# Patient Record
Sex: Female | Born: 1965 | Race: White | Hispanic: No | Marital: Single | Smoking: Current every day smoker
Health system: Southern US, Community
[De-identification: ages and names within clinical notes are randomized; demographics above are authoritative.]

## PROBLEM LIST (undated history)

## (undated) DIAGNOSIS — C539 Malignant neoplasm of cervix uteri, unspecified: Secondary | ICD-10-CM

## (undated) DIAGNOSIS — I10 Essential (primary) hypertension: Secondary | ICD-10-CM

## (undated) HISTORY — PX: CERVIX REMOVAL: SHX592

## (undated) HISTORY — PX: DILATION AND CURETTAGE OF UTERUS: SHX78

## (undated) HISTORY — PX: OTHER SURGICAL HISTORY: SHX169

---

## 2004-09-28 ENCOUNTER — Emergency Department: Payer: Self-pay | Admitting: Internal Medicine

## 2007-10-29 ENCOUNTER — Emergency Department: Payer: Self-pay | Admitting: Emergency Medicine

## 2008-12-23 ENCOUNTER — Emergency Department: Payer: Self-pay | Admitting: Emergency Medicine

## 2009-10-16 ENCOUNTER — Emergency Department: Payer: Self-pay | Admitting: Emergency Medicine

## 2010-09-22 ENCOUNTER — Emergency Department: Payer: Self-pay | Admitting: Emergency Medicine

## 2010-10-23 HISTORY — PX: CERVICAL CONE BIOPSY: SUR198

## 2010-10-23 HISTORY — PX: OTHER SURGICAL HISTORY: SHX169

## 2010-11-20 ENCOUNTER — Emergency Department: Payer: Self-pay | Admitting: Emergency Medicine

## 2011-01-03 ENCOUNTER — Emergency Department: Payer: Self-pay | Admitting: Internal Medicine

## 2011-06-11 ENCOUNTER — Emergency Department: Payer: Self-pay | Admitting: Emergency Medicine

## 2011-06-11 LAB — COMPREHENSIVE METABOLIC PANEL
Albumin: 3.6 g/dL (ref 3.4–5.0)
Anion Gap: 11 (ref 7–16)
Chloride: 99 mmol/L (ref 98–107)
Co2: 27 mmol/L (ref 21–32)
Creatinine: 0.69 mg/dL (ref 0.60–1.30)
Osmolality: 274 (ref 275–301)
Potassium: 3.5 mmol/L (ref 3.5–5.1)
SGOT(AST): 21 U/L (ref 15–37)
SGPT (ALT): 14 U/L
Sodium: 137 mmol/L (ref 136–145)
Total Protein: 8.2 g/dL (ref 6.4–8.2)

## 2011-06-11 LAB — CBC
HCT: 43 % (ref 35.0–47.0)
HGB: 14.4 g/dL (ref 12.0–16.0)
MCV: 98 fL (ref 80–100)
RBC: 4.41 10*6/uL (ref 3.80–5.20)
RDW: 16.8 % — ABNORMAL HIGH (ref 11.5–14.5)
WBC: 12.4 10*3/uL — ABNORMAL HIGH (ref 3.6–11.0)

## 2011-06-11 LAB — URINALYSIS, COMPLETE
Glucose,UR: NEGATIVE mg/dL (ref 0–75)
Hyaline Cast: 25
Nitrite: NEGATIVE
Protein: 100
RBC,UR: 200 /HPF (ref 0–5)
Specific Gravity: 1.035 (ref 1.003–1.030)
WBC UR: 3809 /HPF (ref 0–5)

## 2011-06-11 LAB — LIPASE, BLOOD: Lipase: 68 U/L — ABNORMAL LOW (ref 73–393)

## 2012-05-23 ENCOUNTER — Emergency Department: Payer: Self-pay | Admitting: Emergency Medicine

## 2013-02-07 ENCOUNTER — Emergency Department: Payer: Self-pay | Admitting: Emergency Medicine

## 2013-02-07 LAB — URINALYSIS, COMPLETE
Ketone: NEGATIVE
Leukocyte Esterase: NEGATIVE
RBC,UR: 2246 /HPF (ref 0–5)
Specific Gravity: 1.02 (ref 1.003–1.030)
Squamous Epithelial: 2
WBC UR: 31 /HPF (ref 0–5)

## 2013-02-07 LAB — BASIC METABOLIC PANEL
Anion Gap: 3 — ABNORMAL LOW (ref 7–16)
BUN: 16 mg/dL (ref 7–18)
Calcium, Total: 9.1 mg/dL (ref 8.5–10.1)
Chloride: 106 mmol/L (ref 98–107)
EGFR (African American): >60
EGFR (Non-African Amer.): >60
Sodium: 136 mmol/L (ref 136–145)

## 2013-02-07 LAB — GC/CHLAMYDIA PROBE AMP

## 2013-02-07 LAB — CBC
HCT: 37.4 % (ref 35.0–47.0)
HGB: 13.1 g/dL (ref 12.0–16.0)
MCH: 34.2 pg — ABNORMAL HIGH (ref 26.0–34.0)
RBC: 3.83 10*6/uL (ref 3.80–5.20)
WBC: 7.7 10*3/uL (ref 3.6–11.0)

## 2013-02-07 LAB — WET PREP, GENITAL

## 2013-06-22 ENCOUNTER — Emergency Department: Payer: Self-pay | Admitting: Emergency Medicine

## 2014-03-05 ENCOUNTER — Emergency Department: Payer: Self-pay | Admitting: Internal Medicine

## 2014-03-05 LAB — CBC WITH DIFFERENTIAL/PLATELET
Basophil #: 0.1 10*3/uL (ref 0.0–0.1)
Basophil %: 0.7 %
EOS ABS: 0.1 10*3/uL (ref 0.0–0.7)
Eosinophil %: 0.8 %
HCT: 44.7 % (ref 35.0–47.0)
HGB: 15.2 g/dL (ref 12.0–16.0)
LYMPHS ABS: 1.2 10*3/uL (ref 1.0–3.6)
Lymphocyte %: 13.3 %
MCH: 34.2 pg — AB (ref 26.0–34.0)
MCHC: 34 g/dL (ref 32.0–36.0)
MCV: 101 fL — ABNORMAL HIGH (ref 80–100)
MONOS PCT: 7.3 %
Monocyte #: 0.6 x10 3/mm (ref 0.2–0.9)
Neutrophil #: 6.8 10*3/uL — ABNORMAL HIGH (ref 1.4–6.5)
Neutrophil %: 77.9 %
PLATELETS: 280 10*3/uL (ref 150–440)
RBC: 4.45 10*6/uL (ref 3.80–5.20)
RDW: 13.4 % (ref 11.5–14.5)
WBC: 8.7 10*3/uL (ref 3.6–11.0)

## 2014-03-05 LAB — BASIC METABOLIC PANEL
Anion Gap: 7 (ref 7–16)
BUN: 15 mg/dL (ref 7–18)
CO2: 29 mmol/L (ref 21–32)
CREATININE: 0.75 mg/dL (ref 0.60–1.30)
Calcium, Total: 9.5 mg/dL (ref 8.5–10.1)
Chloride: 99 mmol/L (ref 98–107)
Glucose: 93 mg/dL (ref 65–99)
OSMOLALITY: 271 (ref 275–301)
Potassium: 3.4 mmol/L — ABNORMAL LOW (ref 3.5–5.1)
Sodium: 135 mmol/L — ABNORMAL LOW (ref 136–145)

## 2014-03-05 LAB — TROPONIN I: Troponin-I: <0.02 ng/mL

## 2014-10-29 ENCOUNTER — Encounter: Admission: RE | Payer: Self-pay | Source: Ambulatory Visit

## 2014-10-29 ENCOUNTER — Ambulatory Visit: Admission: RE | Admit: 2014-10-29 | Payer: Self-pay | Source: Ambulatory Visit | Admitting: Gastroenterology

## 2014-10-29 SURGERY — COLONOSCOPY
Anesthesia: General

## 2014-10-30 ENCOUNTER — Encounter: Payer: Self-pay | Admitting: Registered Nurse

## 2015-02-18 ENCOUNTER — Emergency Department
Admission: EM | Admit: 2015-02-18 | Discharge: 2015-02-18 | Disposition: A | Discharge status: Home / Self Care | Payer: Self-pay | Attending: Emergency Medicine | Admitting: Emergency Medicine

## 2015-02-18 ENCOUNTER — Emergency Department: Payer: Self-pay

## 2015-02-18 DIAGNOSIS — Z720 Tobacco use: Secondary | ICD-10-CM | POA: Insufficient documentation

## 2015-02-18 DIAGNOSIS — M5136 Other intervertebral disc degeneration, lumbar region: Secondary | ICD-10-CM | POA: Insufficient documentation

## 2015-02-18 DIAGNOSIS — M5431 Sciatica, right side: Secondary | ICD-10-CM

## 2015-02-18 DIAGNOSIS — M5430 Sciatica, unspecified side: Secondary | ICD-10-CM | POA: Insufficient documentation

## 2015-02-18 DIAGNOSIS — I10 Essential (primary) hypertension: Secondary | ICD-10-CM | POA: Insufficient documentation

## 2015-02-18 DIAGNOSIS — Z880 Allergy status to penicillin: Secondary | ICD-10-CM | POA: Insufficient documentation

## 2015-02-18 HISTORY — DX: Malignant neoplasm of cervix uteri, unspecified: C53.9

## 2015-02-18 HISTORY — DX: Essential (primary) hypertension: I10

## 2015-02-18 MED ORDER — KETOROLAC TROMETHAMINE 30 MG/ML IJ SOLN
30.0000 mg | Freq: Once | INTRAMUSCULAR | Status: AC
Start: 2015-02-18 — End: 2015-02-18
  Administered 2015-02-18: 30 mg via INTRAMUSCULAR
  Filled 2015-02-18: qty 1

## 2015-02-18 MED ORDER — PREDNISONE 10 MG PO TABS: ORAL_TABLET | ORAL | Status: DC

## 2015-02-18 MED ORDER — CYCLOBENZAPRINE HCL 10 MG PO TABS: ORAL_TABLET | ORAL | Status: DC

## 2015-02-18 NOTE — ED Notes (Signed)
Pt c/o right lower back pain for the past 2 weeks with pain radiating into the right leg.Marland Kitchen

## 2015-02-18 NOTE — Discharge Instructions (Signed)

## 2015-02-18 NOTE — ED Provider Notes (Signed)
CSN: [id number]     Arrival date & time 02/18/15  0801 History   First MD Initiated Contact with Patient 02/18/15 [id number]     Chief Complaint  Patient presents with  . Back Pain     HPI Comments: 49 year old female presents toady complaining of right lower back pain for the past 2 weeks. The pain radiates into her right leg and is stabbing. Also has numbness and tingling in the right leg. No loss of bowel function. Reports urinary urgency and stress incontinence- but this was present prior to the back problem.  Taking over the counter medications without relief. No perianal or genital numbness.   Patient is a 49 y.o. female presenting with back pain. The history is provided by the patient.  Back Pain Location:  Lumbar spine Quality:  Stabbing Radiates to:  R posterior upper leg Pain severity:  Moderate Pain is:  Same all the time Onset quality:  Gradual Duration:  2 weeks Timing:  Constant Progression:  Worsening Chronicity:  Recurrent Context: occupational injury   Relieved by:  None tried Worsened by:  Ambulation, movement, standing and twisting Associated symptoms: numbness and paresthesias   Associated symptoms: no bowel incontinence, no perianal numbness, no tingling and no weakness     Past Medical History  Diagnosis Date  . Hypertension   . Cervical cancer    Past Surgical History  Procedure Laterality Date  . Cervix removal     No family history on file. Social History  Substance Use Topics  . Smoking status: Current Every Day Smoker -- 1.00 packs/day    Types: Cigarettes  . Smokeless tobacco: None  . Alcohol Use: No   OB History    No data available     Review of Systems  Gastrointestinal: Negative for bowel incontinence.  Genitourinary: Positive for urgency.  Musculoskeletal: Positive for myalgias, back pain and arthralgias. Negative for gait problem.  Neurological: Positive for numbness and paresthesias. Negative for tingling and weakness.  All other  systems reviewed and are negative.     Allergies  Demerol; Codeine; and Penicillins  Home Medications   Prior to Admission medications   Medication Sig Start Date End Date Taking? Authorizing Helayne Metsker  cyclobenzaprine (FLEXERIL) 10 MG tablet 1 tab at night as needed 02/18/15   Corliss Parish, PA-C  predniSONE (DELTASONE) 10 MG tablet Taper: 6,5,4,3,2,1 02/18/15   Shayne Alken V, PA-C   BP 133/86 mmHg  Pulse 93  Temp(Src) 98 F (36.7 C) (Oral)  Resp 18  Ht 5\' 6"  (1.676 m)  Wt 145 lb (65.772 kg)  BMI 23.41 kg/m2  SpO2 98% Physical Exam  Constitutional: She is oriented to person, place, and time. Vital signs are normal. She appears well-developed and well-nourished. She is active.  Non-toxic appearance. She does not have a sickly appearance. She does not appear ill.  HENT:  Head: Normocephalic and atraumatic.  Musculoskeletal: Normal range of motion. She exhibits tenderness.       Right hip: Normal. She exhibits normal range of motion, normal strength and no tenderness.       Left hip: Normal. She exhibits normal range of motion, normal strength and no tenderness.       Lumbar back: She exhibits tenderness and bony tenderness. She exhibits no swelling.  TTP over L3-L5. Right lumbar paraspinal muscles tender to palpation   Neurological: She is alert and oriented to person, place, and time. She has normal strength. She displays normal reflexes. She exhibits normal muscle  tone. Coordination and gait normal.  Reflex Scores:      Patellar reflexes are 2+ on the right side and 2+ on the left side. +SLR on the right at 15 degrees Negative SLR on the left Negative FABER bilaterally  Equal great toe raise bilaterally   Skin: Skin is warm and dry.  Psychiatric: She has a normal mood and affect. Her behavior is normal. Judgment and thought content normal.  Nursing note and vitals reviewed.   ED Course  Procedures (including critical care time) Labs Review Labs Reviewed - No data to  display  Imaging Review Dg Lumbar Spine 2-3 Views  02/18/2015   CLINICAL DATA:  Right lower back pain for the past 2 weeks radiating down right leg. Denies injury. Pulling and pushing heavy equipment at work. Subsequent encounter.  EXAM: LUMBAR SPINE - 2-3 VIEW  COMPARISON:  05/23/2012.  FINDINGS: Transitional L5 vertebral body.  Scoliosis lumbar spine convex to the left.  2.8 mm anterior slip L3 upon L4 with mild L3-4 disc space narrowing.  5.2 mm anterior slip L4 with mild L4-5 disc space narrowing and bilateral facet joint degenerative changes.  Minimal Schmorl's node deformity superior endplate L2.  No acute compression fracture.  Vascular calcifications advanced for patient's age.  IMPRESSION: Transitional L5 vertebral body.  Scoliosis lumbar spine convex to the left.  2.8 mm anterior slip L3 upon L4 with mild L3-4 disc space narrowing.  5.2 mm anterior slip L4 with mild L4-5 disc space narrowing and bilateral facet joint degenerative changes.  Vascular calcifications advanced for patient's age.   Electronically Signed   By: Genia Del M.D.   On: 02/18/2015 09:14   I have personally reviewed and evaluated these images and lab results as part of my medical decision-making.   EKG Interpretation None      MDM  Discussed results with patient - encouraged her to establish care with a PCP for outpatient treatment of low back pain. Prednisone taper for sciatica. Flexeril QHS. - drowsiness warning given Final diagnoses:  Degenerative disc disease, lumbar  Right sided sciatica        Corliss Parish, PA-C 02/18/15 1016  Delman Kitten, MD 02/18/15 1541

## 2015-05-31 ENCOUNTER — Emergency Department: Payer: BLUE CROSS/BLUE SHIELD

## 2015-05-31 ENCOUNTER — Encounter: Payer: Self-pay | Admitting: Emergency Medicine

## 2015-05-31 ENCOUNTER — Emergency Department
Admission: EM | Admit: 2015-05-31 | Discharge: 2015-05-31 | Disposition: A | Discharge status: Home / Self Care | Payer: BLUE CROSS/BLUE SHIELD | Attending: Student | Admitting: Student

## 2015-05-31 DIAGNOSIS — F17210 Nicotine dependence, cigarettes, uncomplicated: Secondary | ICD-10-CM | POA: Diagnosis not present

## 2015-05-31 DIAGNOSIS — Y92009 Unspecified place in unspecified non-institutional (private) residence as the place of occurrence of the external cause: Secondary | ICD-10-CM | POA: Diagnosis not present

## 2015-05-31 DIAGNOSIS — Z79899 Other long term (current) drug therapy: Secondary | ICD-10-CM | POA: Diagnosis not present

## 2015-05-31 DIAGNOSIS — R55 Syncope and collapse: Secondary | ICD-10-CM | POA: Diagnosis not present

## 2015-05-31 DIAGNOSIS — W07XXXA Fall from chair, initial encounter: Secondary | ICD-10-CM | POA: Insufficient documentation

## 2015-05-31 DIAGNOSIS — Y9389 Activity, other specified: Secondary | ICD-10-CM | POA: Insufficient documentation

## 2015-05-31 DIAGNOSIS — Z043 Encounter for examination and observation following other accident: Secondary | ICD-10-CM | POA: Diagnosis not present

## 2015-05-31 DIAGNOSIS — Y998 Other external cause status: Secondary | ICD-10-CM | POA: Diagnosis not present

## 2015-05-31 DIAGNOSIS — R42 Dizziness and giddiness: Secondary | ICD-10-CM | POA: Diagnosis not present

## 2015-05-31 DIAGNOSIS — Z880 Allergy status to penicillin: Secondary | ICD-10-CM | POA: Diagnosis not present

## 2015-05-31 DIAGNOSIS — I10 Essential (primary) hypertension: Secondary | ICD-10-CM | POA: Diagnosis not present

## 2015-05-31 LAB — BASIC METABOLIC PANEL
Anion gap: 7 (ref 5–15)
BUN: 17 mg/dL (ref 6–20)
CO2: 28 mmol/L (ref 22–32)
Calcium: 9.4 mg/dL (ref 8.9–10.3)
Chloride: 103 mmol/L (ref 101–111)
Creatinine, Ser: 0.84 mg/dL (ref 0.44–1.00)
GFR calc Af Amer: >60 mL/min (ref >60)
GFR calc non Af Amer: >60 mL/min (ref >60)
GLUCOSE: 104 mg/dL — AB (ref 65–99)
Potassium: 3.9 mmol/L (ref 3.5–5.1)
Sodium: 138 mmol/L (ref 135–145)

## 2015-05-31 LAB — CBC WITH DIFFERENTIAL/PLATELET
BASOS ABS: 0.1 10*3/uL (ref 0–0.1)
Basophils Relative: 1 %
EOS PCT: 1 %
Eosinophils Absolute: 0.1 10*3/uL (ref 0–0.7)
HEMATOCRIT: 45.1 % (ref 35.0–47.0)
Hemoglobin: 15 g/dL (ref 12.0–16.0)
LYMPHS ABS: 1.1 10*3/uL (ref 1.0–3.6)
LYMPHS PCT: 9 %
MCH: 32.8 pg (ref 26.0–34.0)
MCHC: 33.2 g/dL (ref 32.0–36.0)
MCV: 98.8 fL (ref 80.0–100.0)
MONO ABS: 0.7 10*3/uL (ref 0.2–0.9)
MONOS PCT: 6 %
Neutro Abs: 9.7 10*3/uL — ABNORMAL HIGH (ref 1.4–6.5)
Neutrophils Relative %: 83 %
PLATELETS: 270 10*3/uL (ref 150–440)
RBC: 4.57 MIL/uL (ref 3.80–5.20)
RDW: 13.6 % (ref 11.5–14.5)
WBC: 11.6 10*3/uL — ABNORMAL HIGH (ref 3.6–11.0)

## 2015-05-31 LAB — TSH: TSH: 0.927 u[IU]/mL (ref 0.350–4.500)

## 2015-05-31 LAB — T4, FREE: FREE T4: 0.8 ng/dL (ref 0.61–1.12)

## 2015-05-31 LAB — TROPONIN I

## 2015-05-31 MED ORDER — SODIUM CHLORIDE 0.9 % IV BOLUS (SEPSIS)
1000.0000 mL | Freq: Once | INTRAVENOUS | Status: AC
  Administered 2015-05-31: 1000 mL via INTRAVENOUS

## 2015-05-31 MED ORDER — HYDROCHLOROTHIAZIDE 12.5 MG PO CAPS: 12.5000 mg | ORAL_CAPSULE | Freq: Every day | ORAL | Status: DC

## 2015-05-31 NOTE — ED Provider Notes (Signed)
[facility] Emergency Department Provider Note  ____________________________________________  Time seen: Approximately 1:06 PM  I have reviewed the triage vital signs and the nursing notes.   HISTORY  Chief Complaint Loss of Consciousness    HPI Tina Chang is a 50 y.o. female history of hypertension, history of treated cervical cancer status post excision and chemotherapy and 2012 presents for evaluation of a syncopal episode which occurred just prior to arrival, gradual onset, now resolved, no modifying factors. The patient reports that throughout the morning today she has been having lightheadedness with standing. She reports that every time she would stand up she would become very lightheaded, nauseated, her heart would begin to race, her vision would start to black out. She was standing up at her mom's house just prior to arrival, began to feel lightheaded and nauseated, sat down in a chair and put her head on the table, and then fainted and fell off the chair onto the ground. She denies any chest pain or difficulty breathing. She denies any recent illness including no cough, sneezing, runny nose, congestion, vomiting, diarrhea, fevers or chills. No pain in her legs, no hemoptysis, no estrogen use, no recent surgery or prolonged period of immobilization. No prior history of fainting. No family history of sudden cardiac death.   Past Medical History  Diagnosis Date  . Hypertension   . Cervical cancer ([place])     There are no active problems to display for this patient.   Past Surgical History  Procedure Laterality Date  . Cervix removal      Current Outpatient Rx  Name  Route  Sig  Dispense  Refill  . cyclobenzaprine (FLEXERIL) 10 MG tablet      1 tab at night as needed   30 tablet   0   . hydrochlorothiazide (MICROZIDE) 12.5 MG capsule   Oral   Take 1 capsule (12.5 mg total) by mouth daily.   30 capsule   0   . predniSONE (DELTASONE) 10 MG  tablet      Taper: 6,5,4,3,2,1   21 tablet   0     Allergies Demerol; Codeine; and Penicillins  History reviewed. No pertinent family history.  Social History Social History  Substance Use Topics  . Smoking status: Current Every Day Smoker -- 1.00 packs/day    Types: Cigarettes  . Smokeless tobacco: None  . Alcohol Use: No    Review of Systems Constitutional: No fever/chills Eyes: No visual changes. ENT: No sore throat. Cardiovascular: Denies chest pain. Respiratory: Denies shortness of breath. Gastrointestinal: No abdominal pain.  No nausea, no vomiting.  No diarrhea.  No constipation. Genitourinary: Negative for dysuria. Musculoskeletal: Negative for back pain. Skin: Negative for rash. Neurological: Negative for headaches, focal weakness or numbness.  10-point ROS otherwise negative.  ____________________________________________   PHYSICAL EXAM:  VITAL SIGNS: ED Triage Vitals  Enc Vitals Group     BP 05/31/15 1258 164/92 mmHg     Pulse Rate 05/31/15 1258 91     Resp 05/31/15 1258 18     Temp 05/31/15 1258 98 F (36.7 C)     Temp Source 05/31/15 1258 Oral     SpO2 05/31/15 1258 96 %     Weight 05/31/15 1258 143 lb (64.864 kg)     Height 05/31/15 1258 5\' 6"  (1.676 m)     Head Cir --      Peak Flow --      Pain Score --  Pain Loc --      Pain Edu? --      Excl. in [place]? --     Constitutional: Alert and oriented. Well appearing and in no acute distress. Eyes: Conjunctivae are normal. PERRL. EOMI. Head: Atraumatic. Nose: No congestion/rhinnorhea. Mouth/Throat: Mucous membranes are moist.  Oropharynx non-erythematous. Neck: No stridor.   Cardiovascular: Normal rate, regular rhythm. Grossly normal heart sounds.  Good peripheral circulation. Respiratory: Normal respiratory effort.  No retractions. Lungs CTAB. Gastrointestinal: Soft and nontender. No distention.  No CVA tenderness. Genitourinary: deferred Musculoskeletal: No lower extremity tenderness  nor edema.  No joint effusions. No calf swelling, asymmetry or tenderness. Neurologic:  Normal speech and language. No gross focal neurologic deficits are appreciated. 5 out of 5 strength in bilateral upper and lower extremities, sensation intact to light touch throughout, cranial nerves II through XII intact, normal finger-nose-finger. Skin:  Skin is warm, dry and intact. No rash noted. Psychiatric: Mood and affect are normal. Speech and behavior are normal.  ____________________________________________   LABS (all labs ordered are listed, but only abnormal results are displayed)  Labs Reviewed  CBC WITH DIFFERENTIAL/PLATELET - Abnormal; Notable for the following:    WBC 11.6 (*)    Neutro Abs 9.7 (*)    All other components within normal limits  BASIC METABOLIC PANEL - Abnormal; Notable for the following:    Glucose, Bld 104 (*)    All other components within normal limits  TROPONIN I  TSH  T4, FREE   ____________________________________________  EKG  ED ECG REPORT I, Joanne Gavel, the attending physician, personally viewed and interpreted this ECG.   Date: 05/31/2015  EKG Time: 12:57  Rate: 97  Rhythm: normal sinus rhythm  Axis: normal  Intervals:none  ST&T Change: No acute ST elevation. No Q waves.  ____________________________________________  RADIOLOGY  CXR IMPRESSION: No acute disease.  CT head IMPRESSION: Normal head CT. ____________________________________________   PROCEDURES  Procedure(s) performed: None  Critical Care performed: No  ____________________________________________   INITIAL IMPRESSION / ASSESSMENT AND PLAN / ED COURSE  Pertinent labs & imaging results that were available during my care of the patient were reviewed by me and considered in my medical decision making (see chart for details).  Tina Chang is a 50 y.o. female history of hypertension, history of treated cervical cancer status post excision and chemotherapy and  2012 presents for evaluation of a syncopal episode which occurred just prior to arrival. On exam, she is very well-appearing and in no acute distress. Vital signs are stable, she is afebrile. She has a benign and atraumatic physical exam. She has an intact neurological examination. Suspect vasovagal syncope given significant prodrome. No chest pain, no difficulty breathing, reassuring EKG, negative troponin make cardiac cause of syncope unlikely. Intact neurological exam is a purely neurogenic cause of syncope unlikely. The patient has intermittent mild tachycardia, which may be due to mild dehydration. We'll give IV fluids. Labs reviewed and are notable for mild leukocytosis with white blood cell count of 11.6. Normal BMP, negative troponin. CT head, chest x-ray, our studies are pending. Reassess for disposition.  ----------------------------------------- 2:56 PM on 05/31/2015 -----------------------------------------  Are markable CT head and chest x-ray. Tachycardia resolved. Normal thyroid studies. The patient feels better after fluids and with oral hydration. We discussed return precautions, need for close PCP follow-up and she is comfortable with the discharge plan. She has asked me to refill her HCTZ which I have done. She is to follow-up with a primary care doctor  in the next week for blood pressure recheck. DC home. ____________________________________________   FINAL CLINICAL IMPRESSION(S) / ED DIAGNOSES  Final diagnoses:  Syncope, unspecified syncope type      Joanne Gavel, MD 05/31/15 1457

## 2015-05-31 NOTE — ED Notes (Signed)
Pt verbalized understanding of discharge instructions. NAD at this time. 

## 2015-05-31 NOTE — ED Notes (Signed)
Pt arrived by EMS for syncopal episode that was preceded by dizziness, blurred vision and "heart racing". Pt did have LOC which was observed by family members. Pt states she believes it was only for a few minutes. Pt was prescribed BP medication which her primary prescribed but did not have refilled and has not taken for about 2 months.

## 2015-06-02 ENCOUNTER — Encounter: Payer: Self-pay | Admitting: *Deleted

## 2015-06-02 ENCOUNTER — Observation Stay
Admission: EM | Admit: 2015-06-02 | Discharge: 2015-06-03 | Disposition: A | Discharge status: Home / Self Care | Payer: BLUE CROSS/BLUE SHIELD | Attending: Internal Medicine | Admitting: Internal Medicine

## 2015-06-02 DIAGNOSIS — F17210 Nicotine dependence, cigarettes, uncomplicated: Secondary | ICD-10-CM | POA: Insufficient documentation

## 2015-06-02 DIAGNOSIS — Z7982 Long term (current) use of aspirin: Secondary | ICD-10-CM | POA: Diagnosis not present

## 2015-06-02 DIAGNOSIS — R55 Syncope and collapse: Secondary | ICD-10-CM | POA: Diagnosis not present

## 2015-06-02 DIAGNOSIS — Z8541 Personal history of malignant neoplasm of cervix uteri: Secondary | ICD-10-CM | POA: Diagnosis not present

## 2015-06-02 DIAGNOSIS — I10 Essential (primary) hypertension: Secondary | ICD-10-CM | POA: Diagnosis not present

## 2015-06-02 DIAGNOSIS — Z8249 Family history of ischemic heart disease and other diseases of the circulatory system: Secondary | ICD-10-CM | POA: Diagnosis not present

## 2015-06-02 DIAGNOSIS — R002 Palpitations: Secondary | ICD-10-CM | POA: Diagnosis not present

## 2015-06-02 DIAGNOSIS — I499 Cardiac arrhythmia, unspecified: Secondary | ICD-10-CM | POA: Diagnosis present

## 2015-06-02 DIAGNOSIS — R42 Dizziness and giddiness: Secondary | ICD-10-CM | POA: Insufficient documentation

## 2015-06-02 DIAGNOSIS — Z9119 Patient's noncompliance with other medical treatment and regimen: Secondary | ICD-10-CM | POA: Insufficient documentation

## 2015-06-02 DIAGNOSIS — Z880 Allergy status to penicillin: Secondary | ICD-10-CM | POA: Insufficient documentation

## 2015-06-02 DIAGNOSIS — R000 Tachycardia, unspecified: Secondary | ICD-10-CM | POA: Diagnosis not present

## 2015-06-02 DIAGNOSIS — Z885 Allergy status to narcotic agent status: Secondary | ICD-10-CM | POA: Insufficient documentation

## 2015-06-02 DIAGNOSIS — R0602 Shortness of breath: Secondary | ICD-10-CM | POA: Insufficient documentation

## 2015-06-02 LAB — CBC
HEMATOCRIT: 46.2 % (ref 35.0–47.0)
Hemoglobin: 15.5 g/dL (ref 12.0–16.0)
MCH: 32.5 pg (ref 26.0–34.0)
MCHC: 33.6 g/dL (ref 32.0–36.0)
MCV: 96.6 fL (ref 80.0–100.0)
Platelets: 259 10*3/uL (ref 150–440)
RBC: 4.78 MIL/uL (ref 3.80–5.20)
RDW: 13.4 % (ref 11.5–14.5)
WBC: 8.6 10*3/uL (ref 3.6–11.0)

## 2015-06-02 LAB — COMPREHENSIVE METABOLIC PANEL
ALBUMIN: 4.6 g/dL (ref 3.5–5.0)
ALT: 16 U/L (ref 14–54)
AST: 24 U/L (ref 15–41)
Alkaline Phosphatase: 62 U/L (ref 38–126)
Anion gap: 11 (ref 5–15)
BILIRUBIN TOTAL: 0.6 mg/dL (ref 0.3–1.2)
BUN: 23 mg/dL — AB (ref 6–20)
CHLORIDE: 101 mmol/L (ref 101–111)
CO2: 25 mmol/L (ref 22–32)
CREATININE: 0.88 mg/dL (ref 0.44–1.00)
Calcium: 9.9 mg/dL (ref 8.9–10.3)
GFR calc Af Amer: >60 mL/min (ref >60)
GFR calc non Af Amer: >60 mL/min (ref >60)
GLUCOSE: 97 mg/dL (ref 65–99)
POTASSIUM: 4 mmol/L (ref 3.5–5.1)
Sodium: 137 mmol/L (ref 135–145)
Total Protein: 8.2 g/dL — ABNORMAL HIGH (ref 6.5–8.1)

## 2015-06-02 LAB — TROPONIN I: Troponin I: <0.03 ng/mL (ref <0.031)

## 2015-06-02 MED ORDER — ACETAMINOPHEN 325 MG PO TABS
650.0000 mg | ORAL_TABLET | Freq: Four times a day (QID) | ORAL | Status: DC | PRN
  Administered 2015-06-02: 650 mg via ORAL
  Filled 2015-06-02: qty 2

## 2015-06-02 MED ORDER — SODIUM CHLORIDE 0.9 % IV SOLN
INTRAVENOUS | Status: DC
  Administered 2015-06-02 – 2015-06-03 (×2): via INTRAVENOUS

## 2015-06-02 MED ORDER — METOPROLOL TARTRATE 50 MG PO TABS
50.0000 mg | ORAL_TABLET | Freq: Once | ORAL | Status: AC
  Administered 2015-06-02: 50 mg via ORAL
  Filled 2015-06-02: qty 1

## 2015-06-02 MED ORDER — METOPROLOL TARTRATE 25 MG PO TABS
25.0000 mg | ORAL_TABLET | Freq: Two times a day (BID) | ORAL | Status: DC
  Administered 2015-06-02 – 2015-06-03 (×2): 25 mg via ORAL
  Filled 2015-06-02 (×2): qty 1

## 2015-06-02 MED ORDER — SODIUM CHLORIDE 0.9 % IJ SOLN: 3.0000 mL | Freq: Two times a day (BID) | INTRAMUSCULAR | Status: DC

## 2015-06-02 MED ORDER — ACETAMINOPHEN 650 MG RE SUPP: 650.0000 mg | Freq: Four times a day (QID) | RECTAL | Status: DC | PRN

## 2015-06-02 MED ORDER — SODIUM CHLORIDE 0.9 % IV SOLN
1000.0000 mL | Freq: Once | INTRAVENOUS | Status: AC
  Administered 2015-06-02: 1000 mL via INTRAVENOUS

## 2015-06-02 MED ORDER — HYDROCHLOROTHIAZIDE 25 MG PO TABS
25.0000 mg | ORAL_TABLET | Freq: Every day | ORAL | Status: DC
  Administered 2015-06-02 – 2015-06-03 (×2): 25 mg via ORAL
  Filled 2015-06-02 (×2): qty 1

## 2015-06-02 MED ORDER — ASPIRIN EC 81 MG PO TBEC
81.0000 mg | DELAYED_RELEASE_TABLET | Freq: Every day | ORAL | Status: DC
  Administered 2015-06-02 – 2015-06-03 (×2): 81 mg via ORAL
  Filled 2015-06-02 (×2): qty 1

## 2015-06-02 MED ORDER — ENOXAPARIN SODIUM 40 MG/0.4ML ~~LOC~~ SOLN
40.0000 mg | SUBCUTANEOUS | Status: DC
  Administered 2015-06-02: 40 mg via SUBCUTANEOUS
  Filled 2015-06-02 (×2): qty 0.4

## 2015-06-02 MED ORDER — NICOTINE 21 MG/24HR TD PT24
21.0000 mg | MEDICATED_PATCH | Freq: Every day | TRANSDERMAL | Status: DC
  Administered 2015-06-02 – 2015-06-03 (×2): 21 mg via TRANSDERMAL
  Filled 2015-06-02 (×2): qty 1

## 2015-06-02 NOTE — ED Provider Notes (Signed)
[facility] Emergency Department Provider Note  ____________________________________________  Time seen: 12 PM  I have reviewed the triage vital signs and the nursing notes.   HISTORY  Chief Complaint Dizziness    HPI KALINDA GOLDFIELD is a 50 y.o. female who presents with complaints of dizziness and near-syncope. Patient was seen in the emergency department 2 days ago for similar complaints. She reports she went to work today and when she got there she developed palpitations and felt very lightheaded. She did describe some chest tightness. No recent travel. No calf pain or leg swelling. No history of thyroid disease.     Past Medical History  Diagnosis Date  . Hypertension   . Cervical cancer ([place])     There are no active problems to display for this patient.   Past Surgical History  Procedure Laterality Date  . Cervix removal      Current Outpatient Rx  Name  Route  Sig  Dispense  Refill  . cyclobenzaprine (FLEXERIL) 10 MG tablet      1 tab at night as needed   30 tablet   0   . hydrochlorothiazide (MICROZIDE) 12.5 MG capsule   Oral   Take 1 capsule (12.5 mg total) by mouth daily.   30 capsule   0   . predniSONE (DELTASONE) 10 MG tablet      Taper: 6,5,4,3,2,1   21 tablet   0     Allergies Demerol; Codeine; and Penicillins  No family history on file.  Social History Social History  Substance Use Topics  . Smoking status: Current Every Day Smoker -- 1.00 packs/day    Types: Cigarettes  . Smokeless tobacco: None  . Alcohol Use: No    Review of Systems  Constitutional: Negative for fever. Eyes: Negative for visual changes. ENT: Negative for sore throat Cardiovascular: Positive for palpitations Respiratory: Negative for shortness of breath. Gastrointestinal: Negative for abdominal pain, vomiting and diarrhea. Genitourinary: Negative for dysuria. Musculoskeletal: Negative for back pain. Skin: Negative for  rash. Neurological: Negative for headaches Psychiatric: Positive for anxiety    ____________________________________________   PHYSICAL EXAM:  VITAL SIGNS: ED Triage Vitals  Enc Vitals Group     BP 06/02/15 1032 156/107 mmHg     Pulse Rate 06/02/15 1032 86     Resp 06/02/15 1032 20     Temp 06/02/15 1032 98.9 F (37.2 C)     Temp Source 06/02/15 1032 Oral     SpO2 06/02/15 1032 94 %     Weight 06/02/15 1032 143 lb (64.864 kg)     Height 06/02/15 1032 5\' 6"  (1.676 m)     Head Cir --      Peak Flow --      Pain Score 06/02/15 1037 3     Pain Loc --      Pain Edu? --      Excl. in [place]? --      Constitutional: Alert and oriented. Well appearing and in no distress. Eyes: Conjunctivae are normal.  ENT   Head: Normocephalic and atraumatic.   Mouth/Throat: Mucous membranes are moist. Cardiovascular: Normal rate, regular rhythm. Normal and symmetric distal pulses are present in all extremities.  Respiratory: Normal respiratory effort without tachypnea nor retractions. Breath sounds are clear and equal bilaterally.  Gastrointestinal: Soft and non-tender in all quadrants. No distention. There is no CVA tenderness. Genitourinary: deferred Musculoskeletal: Nontender with normal range of motion in all extremities. No lower extremity tenderness nor edema. Neurologic:  Normal speech and language. No gross focal neurologic deficits are appreciated. Skin:  Skin is warm, dry and intact. No rash noted. Psychiatric: Mood and affect are normal. Patient exhibits appropriate insight and judgment.  ____________________________________________    LABS (pertinent positives/negatives)  Labs Reviewed  COMPREHENSIVE METABOLIC PANEL - Abnormal; Notable for the following:    BUN 23 (*)    Total Protein 8.2 (*)    All other components within normal limits  CBC  TROPONIN I    ____________________________________________   EKG  ED ECG REPORT I, Lavonia Drafts, the attending  physician, personally viewed and interpreted this ECG.  Date: 06/02/2015 EKG Time: 11:19 AM Rate: 73 Rhythm: normal sinus rhythm QRS Axis: normal Intervals: normal ST/T Wave abnormalities: normal Conduction Disutrbances: none Narrative Interpretation: unremarkable   ____________________________________________    RADIOLOGY I have personally reviewed any xrays that were ordered on this patient: None  ____________________________________________   PROCEDURES  Procedure(s) performed: none  Critical Care performed: none  ____________________________________________   INITIAL IMPRESSION / ASSESSMENT AND PLAN / ED COURSE  Pertinent labs & imaging results that were available during my care of the patient were reviewed by me and considered in my medical decision making (see chart for details).  Patient with reports of palpitations and dizziness and near syncope. While on the monitor we have noticed intermittent episodes of significant tachycardia to the 150s and 160s although we have not been able to capture an EKG of this. Given her history of lightheadedness and palpitations with this warrants further evaluation will discuss with hospitalist for admission  ____________________________________________   FINAL CLINICAL IMPRESSION(S) / ED DIAGNOSES  Final diagnoses:  Cardiac arrhythmia, unspecified cardiac arrhythmia type  Near syncope     Lavonia Drafts, MD 06/02/15 1424

## 2015-06-02 NOTE — ED Notes (Signed)
Was seen in er 2 days ago for dizziness, today went to work felt her heart started betting fast chest tightness,has been out of her b/p meds

## 2015-06-02 NOTE — Plan of Care (Signed)
Problem: Safety: Goal: Ability to remain free from injury will improve Outcome: Progressing Patient is moderate (+) falls risk, bed alarm on and patient is compliant with safety measures.

## 2015-06-02 NOTE — ED Notes (Signed)
Seen here 2 days ago for dizziness, has been out of her b/p meds for awhile, felt like her heart was racing this am at work

## 2015-06-02 NOTE — H&P (Signed)
[place] [facility] [place] NAME: Tina Chang    MR#:  [id number]  DATE OF BIRTH:  [birth year]  DATE OF ADMISSION:  06/02/2015  PRIMARY CARE PHYSICIAN: Will Bonnet, MD , patient had a primary care physician at [facility] but that physician and left.   REQUESTING/REFERRING PHYSICIAN: Lavonia Drafts  CHIEF COMPLAINT:   Chief Complaint  Patient presents with  . Dizziness    HISTORY OF PRESENT ILLNESS:  Tina Chang  is a 50 y.o. female with a known history of hypertension. She presents back to the emergency room again today. She passed out at her mom's house on Saturday. She has palpitations, gets lightheaded and can't hear or see. She also gets short of breath with some nausea. Today she was at work and was by herself and she ended up sitting down on the floor before she passed out. In the ER, she was found to have periods of tachycardia. Of note she has not been on her metoprolol for a while because her medical physician at [facility] had left the practice and they did not refill her prescriptions.  PAST MEDICAL HISTORY:   Past Medical History  Diagnosis Date  . Hypertension   . Cervical cancer ([place])     PAST SURGICAL HISTORY:   Past Surgical History  Procedure Laterality Date  . Cervix removal      SOCIAL HISTORY:   Social History  Substance Use Topics  . Smoking status: Current Every Day Smoker -- 1.00 packs/day    Types: Cigarettes  . Smokeless tobacco: Not on file  . Alcohol Use: No    FAMILY HISTORY:   Family History  Problem Relation Age of Onset  . Hypertension Mother   . Arrhythmia Mother   . CAD Father     DRUG ALLERGIES:   Allergies  Allergen Reactions  . Codeine Itching  . Demerol [Meperidine] Anaphylaxis  . Penicillins Other (See Comments)    Reaction:  Unknown     REVIEW OF SYSTEMS:  CONSTITUTIONAL: No fever, positive for chills and sweats positive for fatigue.  EYES: Blurred  vision with episode. Wears contacts and reading glasses. EARS, NOSE, AND THROAT: No tinnitus or ear pain. No sore throat. Positive runny nose RESPIRATORY: No cough, positive for shortness of breath with episode, no wheezing or hemoptysis.  CARDIOVASCULAR: Positive for chest pain with episode, no orthopnea, edema.  GASTROINTESTINAL: Positive for nausea. No vomiting, diarrhea or abdominal pain. No blood in bowel movements GENITOURINARY: No dysuria, hematuria.  ENDOCRINE: No polyuria, nocturia,  HEMATOLOGY: No anemia, easy bruising or bleeding SKIN: No rash or lesion. MUSCULOSKELETAL: No joint pain or arthritis.   NEUROLOGIC: No tingling, numbness, weakness.  PSYCHIATRY: No anxiety or depression.   MEDICATIONS AT HOME:   Prior to Admission medications   Medication Sig Start Date End Date Taking? Authorizing Provider  aspirin EC 81 MG tablet Take 81 mg by mouth daily.   Yes Historical Provider, MD  hydrochlorothiazide (HYDRODIURIL) 25 MG tablet Take 25 mg by mouth daily.   Yes Historical Provider, MD  metoprolol tartrate (LOPRESSOR) 25 MG tablet Take 12.5 mg by mouth 2 (two) times daily.    Historical Provider, MD      VITAL SIGNS:  Blood pressure 174/97, pulse 87, temperature 98.9 F (37.2 C), temperature source Oral, resp. rate 15, height 5\' 6"  (1.676 m), weight 64.864 kg (143 lb), SpO2 98 %. Heart rate on telemetry monitoring up to 150 PHYSICAL EXAMINATION:  GENERAL:  50 y.o.-year-old patient lying in the bed with no acute distress.  EYES: Pupils equal, round, reactive to light and accommodation. No scleral icterus. Extraocular muscles intact.  HEENT: Head atraumatic, normocephalic. Oropharynx and nasopharynx clear.  NECK:  Supple, no jugular venous distention. No thyroid enlargement, no tenderness.  LUNGS: Normal breath sounds bilaterally, no wheezing, rales,rhonchi or crepitation. No use of accessory muscles of respiration.  CARDIOVASCULAR: S1, S2 normal, periods of brief  tachycardia. 2/6 systolic murmur, no rubs, or gallops.  ABDOMEN: Soft, nontender, nondistended. Bowel sounds present. No organomegaly or mass.  EXTREMITIES: No pedal edema, cyanosis, or clubbing.  NEUROLOGIC: Cranial nerves II through XII are intact. Muscle strength 5/5 in all extremities. Sensation intact. Gait not checked.  PSYCHIATRIC: The patient is alert and oriented x 3.  SKIN: No rash, lesion, or ulcer.   LABORATORY PANEL:   CBC  Recent Labs Lab 06/02/15 1127  WBC 8.6  HGB 15.5  HCT 46.2  PLT 259   ------------------------------------------------------------------------------------------------------------------  Chemistries   Recent Labs Lab 06/02/15 1127  NA 137  K 4.0  CL 101  CO2 25  GLUCOSE 97  BUN 23*  CREATININE 0.88  CALCIUM 9.9  AST 24  ALT 16  ALKPHOS 62  BILITOT 0.6   ------------------------------------------------------------------------------------------------------------------  Cardiac Enzymes  Recent Labs Lab 06/02/15 1127  TROPONINI <0.03   ------------------------------------------------------------------------------------------------------------------  RADIOLOGY:  No results found.  EKG:   Normal sinus rhythm on EKG On telemetry monitoring brief episodes of tachyarrhythmia.  IMPRESSION AND PLAN:   1. Near-syncope with tachyarrhythmias on telemetry monitoring. Most of what seen is sinus tachycardia. Admit as an observation. Patient recently had an echo as outpatient with Dr. Clayborn Bigness. Consult cardiology. Restart metoprolol 50 mg orally stat and 25 mg twice a day. Monitor on telemetry and see how she does walking around tomorrow. 2. Essential hypertension- continue metoprolol 3. History of cervical cancer 4. Tobacco abuse smoking cessation counseling done 3 minutes by me- nicotine patch ordered 5. Patient will look for a new primary care physician.  All the records are reviewed and case discussed with ED provider. Management  plans discussed with the patient, family and they are in agreement.  CODE STATUS: Full code  TOTAL TIME TAKING CARE OF THIS PATIENT: 50 minutes.    Loletha Grayer M.D on 06/02/2015 at 2:07 PM  Between 7am to 6pm - Pager - [phone]  After 6pm call admission pager [facility]  Office  [phone]  CC: Primary care physician; Will Bonnet, MD

## 2015-06-03 LAB — BASIC METABOLIC PANEL
Anion gap: 9 (ref 5–15)
BUN: 23 mg/dL — AB (ref 6–20)
CHLORIDE: 102 mmol/L (ref 101–111)
CO2: 27 mmol/L (ref 22–32)
CREATININE: 0.99 mg/dL (ref 0.44–1.00)
Calcium: 9.5 mg/dL (ref 8.9–10.3)
GFR calc non Af Amer: >60 mL/min (ref >60)
Glucose, Bld: 103 mg/dL — ABNORMAL HIGH (ref 65–99)
POTASSIUM: 3.8 mmol/L (ref 3.5–5.1)
SODIUM: 138 mmol/L (ref 135–145)

## 2015-06-03 LAB — CBC
HEMATOCRIT: 43.1 % (ref 35.0–47.0)
HEMOGLOBIN: 14.3 g/dL (ref 12.0–16.0)
MCH: 32.2 pg (ref 26.0–34.0)
MCHC: 33.3 g/dL (ref 32.0–36.0)
MCV: 96.7 fL (ref 80.0–100.0)
Platelets: 257 10*3/uL (ref 150–440)
RBC: 4.45 MIL/uL (ref 3.80–5.20)
RDW: 13.3 % (ref 11.5–14.5)
WBC: 7.6 10*3/uL (ref 3.6–11.0)

## 2015-06-03 MED ORDER — ASPIRIN 81 MG PO TBEC: 81.0000 mg | DELAYED_RELEASE_TABLET | Freq: Every day | ORAL | Status: AC

## 2015-06-03 MED ORDER — METOPROLOL TARTRATE 25 MG PO TABS: 25.0000 mg | ORAL_TABLET | Freq: Two times a day (BID) | ORAL | Status: DC

## 2015-06-03 MED ORDER — HYDROCHLOROTHIAZIDE 25 MG PO TABS: 25.0000 mg | ORAL_TABLET | Freq: Every day | ORAL | Status: DC

## 2015-06-03 MED ORDER — NICOTINE 21 MG/24HR TD PT24: 21.0000 mg | MEDICATED_PATCH | Freq: Every day | TRANSDERMAL | Status: DC

## 2015-06-03 NOTE — Discharge Summary (Signed)
[place] [facility] [place] NAME: Tina Chang    MR#:  [id number]  DATE OF BIRTH:  07/06/1965  DATE OF ADMISSION:  06/02/2015 ADMITTING PHYSICIAN: Loletha Grayer, MD  DATE OF DISCHARGE: 06/03/2015  PRIMARY CARE PHYSICIAN: Will Bonnet, MD    ADMISSION DIAGNOSIS:  Near syncope [R55] Cardiac arrhythmia, unspecified cardiac arrhythmia type [I49.9]  DISCHARGE DIAGNOSIS:  Active Problems:   Syncope   SECONDARY DIAGNOSIS:   Past Medical History  Diagnosis Date  . Hypertension   . Cervical cancer [facility])     HOSPITAL COURSE:   50 year old female who presented with near syncope and tachycardia arrhythmia. For further details please refer the H&P.  1. Near syncope: This is likely secondary to her sinus tachycardia. Patient was seen and evaluated by radiology. Patient was recommended to restart metoprolol. She has seen Dr.Callwood would in the past for similar issues. She has been on a 48 hour Holter monitor to evaluate for arrhythmias. She was told that she should start on metoprolol 25 mg by mouth twice a day. She ran out of her medications and then she presented with her symptoms. She'll follow-up with cardiology in 1 week. Troponins 2 were negative. Patient will follow-up for echocardiogram results with Dr.Callwood.  2. Essential hypertension: Patient will be discharged on HCTZ and metoprolol. I prescribed both of these medications.  3. Tobacco dependence: Patient will be discharged to nicotine patch. She was counseled by admitting physician to stop smoking. She is also counseled by myself to stop smoking. DISCHARGE CONDITIONS AND DIET:  Patient is stable for discharge on a heart healthy diet  CONSULTS OBTAINED:  Treatment Team:  Loletha Grayer, MD Yolonda Kida, MD  DRUG ALLERGIES:   Allergies  Allergen Reactions  . Codeine Itching  . Demerol [Meperidine] Anaphylaxis  . Penicillins Other (See Comments)    Reaction:   Unknown     DISCHARGE MEDICATIONS:   Current Discharge Medication List    START taking these medications   Details  !! aspirin EC 81 MG EC tablet Take 1 tablet (81 mg total) by mouth daily. Qty: 120 tablet, Refills: 0    nicotine (NICODERM CQ - DOSED IN MG/24 HOURS) 21 mg/24hr patch Place 1 patch (21 mg total) onto the skin daily. Qty: 28 patch, Refills: 0     !! - Potential duplicate medications found. Please discuss with provider.    CONTINUE these medications which have CHANGED   Details  hydrochlorothiazide (HYDRODIURIL) 25 MG tablet Take 1 tablet (25 mg total) by mouth daily. Qty: 30 tablet, Refills: 0    metoprolol tartrate (LOPRESSOR) 25 MG tablet Take 1 tablet (25 mg total) by mouth 2 (two) times daily. Qty: 60 tablet, Refills: 0      CONTINUE these medications which have NOT CHANGED   Details  !! aspirin EC 81 MG tablet Take 81 mg by mouth daily.     !! - Potential duplicate medications found. Please discuss with provider.            Today   CHIEF COMPLAINT:   Patient doing well this point. Patient denies syncope or palpitations.   VITAL SIGNS:  Blood pressure 129/67, pulse 73, temperature 97.7 F (36.5 C), temperature source Oral, resp. rate 16, height 5\' 6"  (1.676 m), weight 66.679 kg (147 lb), SpO2 96 %.   REVIEW OF SYSTEMS:  Review of Systems  Constitutional: Negative for fever, chills and malaise/fatigue.  HENT: Negative for sore throat.  Eyes: Negative for blurred vision.  Respiratory: Negative for cough, hemoptysis, shortness of breath and wheezing.   Cardiovascular: Negative for chest pain, palpitations and leg swelling.  Gastrointestinal: Negative for nausea, vomiting, abdominal pain, diarrhea and blood in stool.  Genitourinary: Negative for dysuria.  Musculoskeletal: Negative for back pain.  Neurological: Negative for dizziness, tremors and headaches.  Endo/Heme/Allergies: Does not bruise/bleed easily.     PHYSICAL EXAMINATION:   GENERAL:  50 y.o.-year-old patient lying in the bed with no acute distress.  NECK:  Supple, no jugular venous distention. No thyroid enlargement, no tenderness.  LUNGS: Normal breath sounds bilaterally, no wheezing, rales,rhonchi  No use of accessory muscles of respiration.  CARDIOVASCULAR: S1, S2 normal. No murmurs, rubs, or gallops.  ABDOMEN: Soft, non-tender, non-distended. Bowel sounds present. No organomegaly or mass.  EXTREMITIES: No pedal edema, cyanosis, or clubbing.  PSYCHIATRIC: The patient is alert and oriented x 3.  SKIN: No obvious rash, lesion, or ulcer.   DATA REVIEW:   CBC  Recent Labs Lab 06/03/15 0444  WBC 7.6  HGB 14.3  HCT 43.1  PLT 257    Chemistries   Recent Labs Lab 06/02/15 1127 06/03/15 0444  NA 137 138  K 4.0 3.8  CL 101 102  CO2 25 27  GLUCOSE 97 103*  BUN 23* 23*  CREATININE 0.88 0.99  CALCIUM 9.9 9.5  AST 24  --   ALT 16  --   ALKPHOS 62  --   BILITOT 0.6  --     Cardiac Enzymes  Recent Labs Lab 05/31/15 1314 06/02/15 1127  TROPONINI <0.03 <0.03    Microbiology Results  @[email or web address] @  RADIOLOGY:  No results found.    Management plans discussed with the patient and she is in agreement. Stable for discharge home  Patient should follow up with cardiology 1 week  CODE STATUS:     Code Status Orders        Start     Ordered   06/02/15 1404  Full code   Continuous     06/02/15 1404    Code Status History    Date Active Date Inactive Code Status Order ID Comments User Context   This patient has a current code status but no historical code status.      TOTAL TIME TAKING CARE OF THIS PATIENT: 35 minutes.    Note: This dictation was prepared with Dragon dictation along with smaller phrase technology. Any transcriptional errors that result from this process are unintentional.  Yusuke Beza M.D on 06/03/2015 at 11:01 AM  Between 7am to 6pm - Pager - [phone] After 6pm go to [email or web address] - password [email or web address]  [facility]  Office  [phone]  CC: Primary care physician; Will Bonnet, MD

## 2015-06-03 NOTE — Progress Notes (Signed)
Patient has rested quietly tonight. No complaints of pain and no signs of discomfort or distress noted. Patient's urine observed to be blood tinged and per patient this happens occasionally. Nursing staff will continue to monitor. Earleen Reaper, RN

## 2015-06-03 NOTE — Progress Notes (Signed)
Pt discharged to home, Iv and telemetry removed, reviewed instructions and home meds, rx called into pharmacy, no questions verbalized, escorted by volunteer

## 2015-06-03 NOTE — Care Management (Signed)
Placed in observation for near syncope.  Noted to have some tachy arrhythmia on telemetry.  Cardiology consult is pending.  Was on metoprolol which appears to have been ordered by her OB/GYN but stopped taking with this physician left the practice.  Will provide patient with list of medical physicians taking new patients

## 2015-06-03 NOTE — Consult Note (Signed)
Reason for Consult: syncope SVT tachycardia Referring Physician:  Prentice Docker MD,  [facility]  Dr Tina Chang  Tina Chang is an 50 y.o. female.  HPI:  The patient has a history of SVT in the past recently had an episode of tachycardia and event she really had a syncopal episode. Patient has known palpitations and tachycardia was treated with metoprolol. She quit taking metoprolol a few months ago when she was unable to get a refill on her doctor left town. Patient id done reasonably well up until now she had had several see short episodes of palpitations tachycardia but this flow was much more sustained she got lightheaded and passed out she states this the 1st episode of loss of conscious. Patient complains of mild weakness lightheadedness fatigued minimal nausea no vomiting no fever chills sweats no trauma  Past Medical History  Diagnosis Date  . Hypertension   . Cervical cancer [facility])     Past Surgical History  Procedure Laterality Date  . Cervix removal      Family History  Problem Relation Age of Onset  . Hypertension Mother   . Arrhythmia Mother   . CAD Father     Social History:  reports that she has been smoking Cigarettes.  She has been smoking about 1.00 pack per day. She does not have any smokeless tobacco history on file. She reports that she does not drink alcohol or use illicit drugs.  Allergies:  Allergies  Allergen Reactions  . Codeine Itching  . Demerol [Meperidine] Anaphylaxis  . Penicillins Other (See Comments)    Reaction:  Unknown     Medications: I have reviewed the patient's current medications.  Results for orders placed or performed during the hospital encounter of 06/02/15 (from the past 48 hour(s))  CBC     Status: None   Collection Time: 06/02/15 11:27 AM  Result Value Ref Range   WBC 8.6 3.6 - 11.0 K/uL   RBC 4.78 3.80 - 5.20 MIL/uL   Hemoglobin 15.5 12.0 - 16.0 g/dL   HCT 46.2 35.0 - 47.0 %   MCV 96.6 80.0 - 100.0 fL   MCH 32.5 26.0 - 34.0 pg   MCHC 33.6 32.0 - 36.0 g/dL   RDW 13.4 11.5 - 14.5 %   Platelets 259 150 - 440 K/uL  Comprehensive metabolic panel     Status: Abnormal   Collection Time: 06/02/15 11:27 AM  Result Value Ref Range   Sodium 137 135 - 145 mmol/L   Potassium 4.0 3.5 - 5.1 mmol/L   Chloride 101 101 - 111 mmol/L   CO2 25 22 - 32 mmol/L   Glucose, Bld 97 65 - 99 mg/dL   BUN 23 (H) 6 - 20 mg/dL   Creatinine, Ser 0.88 0.44 - 1.00 mg/dL   Calcium 9.9 8.9 - 10.3 mg/dL   Total Protein 8.2 (H) 6.5 - 8.1 g/dL   Albumin 4.6 3.5 - 5.0 g/dL   AST 24 15 - 41 U/L   ALT 16 14 - 54 U/L   Alkaline Phosphatase 62 38 - 126 U/L   Total Bilirubin 0.6 0.3 - 1.2 mg/dL   GFR calc non Af Amer >60 >60 mL/min   GFR calc Af Amer >60 >60 mL/min    Comment: (NOTE) The eGFR has been calculated using the CKD EPI equation. This calculation has not been validated in all clinical situations. eGFR's persistently <60 mL/min signify possible Chronic Kidney Disease.    Anion gap 11 5 -  15  Troponin I     Status: None   Collection Time: 06/02/15 11:27 AM  Result Value Ref Range   Troponin I <0.03 <0.031 ng/mL    Comment:        NO INDICATION OF MYOCARDIAL INJURY.   Basic metabolic panel     Status: Abnormal   Collection Time: 06/03/15  4:44 AM  Result Value Ref Range   Sodium 138 135 - 145 mmol/L   Potassium 3.8 3.5 - 5.1 mmol/L   Chloride 102 101 - 111 mmol/L   CO2 27 22 - 32 mmol/L   Glucose, Bld 103 (H) 65 - 99 mg/dL   BUN 23 (H) 6 - 20 mg/dL   Creatinine, Ser 0.99 0.44 - 1.00 mg/dL   Calcium 9.5 8.9 - 10.3 mg/dL   GFR calc non Af Amer >60 >60 mL/min   GFR calc Af Amer >60 >60 mL/min    Comment: (NOTE) The eGFR has been calculated using the CKD EPI equation. This calculation has not been validated in all clinical situations. eGFR's persistently <60 mL/min signify possible Chronic Kidney Disease.    Anion gap 9 5 - 15  CBC     Status: None   Collection Time: 06/03/15  4:44 AM  Result Value  Ref Range   WBC 7.6 3.6 - 11.0 K/uL   RBC 4.45 3.80 - 5.20 MIL/uL   Hemoglobin 14.3 12.0 - 16.0 g/dL   HCT 43.1 35.0 - 47.0 %   MCV 96.7 80.0 - 100.0 fL   MCH 32.2 26.0 - 34.0 pg   MCHC 33.3 32.0 - 36.0 g/dL   RDW 13.3 11.5 - 14.5 %   Platelets 257 150 - 440 K/uL    No results found.  Review of Systems  Constitutional: Negative.   HENT: Negative.   Eyes: Negative.   Respiratory: Negative.   Cardiovascular: Positive for chest pain and palpitations.  Gastrointestinal: Negative.   Genitourinary: Negative.   Musculoskeletal: Negative.   Skin: Negative.   Neurological: Positive for loss of consciousness.  Endo/Heme/Allergies: Negative.   Psychiatric/Behavioral: Negative.    Blood pressure 129/67, pulse 73, temperature 97.7 F (36.5 C), temperature source Oral, resp. rate 16, height '5\' 6"'  (1.676 m), weight 66.679 kg (147 lb), SpO2 96 %. Physical Exam  Constitutional: She is oriented to person, place, and time. She appears well-developed and well-nourished.  HENT:  Head: Normocephalic and atraumatic.  Right Ear: External ear normal.  Eyes: Conjunctivae and EOM are normal. Pupils are equal, round, and reactive to light.  Neck: Normal range of motion. Neck supple.  Cardiovascular: Normal rate, regular rhythm and normal heart sounds.   Respiratory: Effort normal and breath sounds normal.  GI: Soft. Bowel sounds are normal.  Musculoskeletal: Normal range of motion.  Neurological: She is alert and oriented to person, place, and time. She has normal reflexes.  Skin: Skin is warm and dry.  Psychiatric: She has a normal mood and affect. Her behavior is normal. Thought content normal.    Assessment/Plan:  syncope  SVT  tachycardia  smoking  hypertension  history of cervical cancer  noncompliance . PLAN  agree with admission to telemetry  continue metoprolol 25 mg twice a day  aspirin for anticoagulation  hypertension control metoprolol  continue hydrochlorothiazide for  blood pressure  advised patient to quit smoking  outpatient fo  Follow-up with Oncology for cervical cancer   Tina Chang D. 06/03/2015, 11:25 AM

## 2015-06-03 NOTE — Progress Notes (Signed)
Subjective:   improve symptoms no further palpitations or tachycardia patient feels much better.  No further syncope ambulating ready to go home  Objective:  Vital Signs in the last 24 hours: Temp:  [97.7 F (36.5 C)-98 F (36.7 C)] 97.7 F (36.5 C) (01/10 0502) Pulse Rate:  [69-125] 73 (01/10 0502) Resp:  [13-19] 16 (01/10 0502) BP: (129-185)/(67-140) 129/67 mmHg (01/10 0502) SpO2:  [94 %-100 %] 96 % (01/10 0502) Weight:  [66.679 kg (147 lb)] 66.679 kg (147 lb) (01/09 1730)  Intake/Output from previous day: 01/09 0701 - 01/10 0700 In: 1117.5 [P.O.:240; I.V.:877.5] Out: 1550 [Urine:1550] Intake/Output from this shift: Total I/O In: 240 [P.O.:240] Out: 400 [Urine:400]  Physical Exam: General appearance: alert, cooperative and appears stated age Neck: no adenopathy, no carotid bruit, no JVD, supple, symmetrical, trachea midline and thyroid not enlarged, symmetric, no tenderness/mass/nodules Lungs: clear to auscultation bilaterally Heart: regular rate and rhythm, S1, S2 normal, no murmur, click, rub or gallop Abdomen: soft, non-tender; bowel sounds normal; no masses,  no organomegaly Extremities: extremities normal, atraumatic, no cyanosis or edema Pulses: 2+ and symmetric Skin: Skin color, texture, turgor normal. No rashes or lesions Neurologic: Alert and oriented X 3, normal strength and tone. Normal symmetric reflexes. Normal coordination and gait  Lab Results:  Recent Labs  06/02/15 1127 06/03/15 0444  WBC 8.6 7.6  HGB 15.5 14.3  PLT 259 257    Recent Labs  06/02/15 1127 06/03/15 0444  NA 137 138  K 4.0 3.8  CL 101 102  CO2 25 27  GLUCOSE 97 103*  BUN 23* 23*  CREATININE 0.88 0.99    Recent Labs  05/31/15 1314 06/02/15 1127  TROPONINI <0.03 <0.03   Hepatic Function Panel  Recent Labs  06/02/15 1127  PROT 8.2*  ALBUMIN 4.6  AST 24  ALT 16  ALKPHOS 62  BILITOT 0.6   No results for input(s): CHOL in the last 72 hours. No results for  input(s): PROTIME in the last 72 hours.  Imaging: Imaging results have been reviewed  Cardiac Studies:  Assessment/Plan:  Arrhythmia Palpitations Syncope   tachycardia  smoking  hypertension  history of cervical cancer . PLAN  increased activity  continue metoprolol tartrate 25 mg twice a day  agree with aspirin 81 mg once a day  continue losartan for hypertension  advise patient to quit smoking .If patient has further symptoms consider ablation for SVT  Have the patient follow-up with Cardiology 1-2 weeks     Vipul Cafarelli D. 06/03/2015, 11:39 AM

## 2015-08-18 ENCOUNTER — Ambulatory Visit: Payer: Self-pay | Admitting: Family Medicine

## 2015-09-25 ENCOUNTER — Ambulatory Visit: Payer: Self-pay | Admitting: Family Medicine

## 2015-11-26 ENCOUNTER — Ambulatory Visit: Payer: Self-pay | Admitting: Family Medicine

## 2016-01-26 ENCOUNTER — Encounter: Payer: Self-pay | Admitting: Medical Oncology

## 2016-01-26 ENCOUNTER — Emergency Department
Admission: EM | Admit: 2016-01-26 | Discharge: 2016-01-26 | Disposition: A | Discharge status: Home / Self Care | Payer: BLUE CROSS/BLUE SHIELD | Attending: Emergency Medicine | Admitting: Emergency Medicine

## 2016-01-26 DIAGNOSIS — M5441 Lumbago with sciatica, right side: Secondary | ICD-10-CM | POA: Diagnosis not present

## 2016-01-26 DIAGNOSIS — M545 Low back pain: Secondary | ICD-10-CM | POA: Diagnosis present

## 2016-01-26 DIAGNOSIS — F17210 Nicotine dependence, cigarettes, uncomplicated: Secondary | ICD-10-CM | POA: Diagnosis not present

## 2016-01-26 DIAGNOSIS — M5432 Sciatica, left side: Secondary | ICD-10-CM

## 2016-01-26 DIAGNOSIS — I10 Essential (primary) hypertension: Secondary | ICD-10-CM | POA: Diagnosis not present

## 2016-01-26 DIAGNOSIS — Z79899 Other long term (current) drug therapy: Secondary | ICD-10-CM | POA: Diagnosis not present

## 2016-01-26 DIAGNOSIS — Z8541 Personal history of malignant neoplasm of cervix uteri: Secondary | ICD-10-CM | POA: Insufficient documentation

## 2016-01-26 DIAGNOSIS — Z7982 Long term (current) use of aspirin: Secondary | ICD-10-CM | POA: Insufficient documentation

## 2016-01-26 DIAGNOSIS — M5442 Lumbago with sciatica, left side: Secondary | ICD-10-CM | POA: Diagnosis not present

## 2016-01-26 DIAGNOSIS — M5431 Sciatica, right side: Secondary | ICD-10-CM

## 2016-01-26 MED ORDER — OXYCODONE-ACETAMINOPHEN 5-325 MG PO TABS
2.0000 | ORAL_TABLET | Freq: Once | ORAL | Status: AC
  Administered 2016-01-26: 2 via ORAL
  Filled 2016-01-26: qty 2

## 2016-01-26 MED ORDER — KETOROLAC TROMETHAMINE 60 MG/2ML IM SOLN
60.0000 mg | Freq: Once | INTRAMUSCULAR | Status: AC
  Administered 2016-01-26: 60 mg via INTRAMUSCULAR
  Filled 2016-01-26: qty 2

## 2016-01-26 MED ORDER — GABAPENTIN 300 MG PO CAPS: 300.0000 mg | ORAL_CAPSULE | Freq: Three times a day (TID) | ORAL | 0 refills | Status: DC

## 2016-01-26 MED ORDER — IBUPROFEN 800 MG PO TABS: 800.0000 mg | ORAL_TABLET | Freq: Three times a day (TID) | ORAL | 0 refills | Status: DC | PRN

## 2016-01-26 NOTE — ED Triage Notes (Signed)
Lower back pain with radiation of pain down into hips x 2 months.

## 2016-01-26 NOTE — ED Notes (Signed)
See triage note  States she has a hx of arthritis in back  denies recent injury

## 2016-01-26 NOTE — ED Provider Notes (Signed)
[facility] Emergency Department Provider Note  ____________________________________________  Time seen: Approximately 10:36 AM  I have reviewed the triage vital signs and the nursing notes.   HISTORY  Chief Complaint Back Pain    HPI Tina Chang is a 50 y.o. female presents for evaluation of low back pain radiating down her hips 2 months. Past medical history for sciatica. Patient reports not having a PCP and needs a referral. She complains of having bilateral numbness and tingling to her feet and buttocks   Past Medical History:  Diagnosis Date  . Cervical cancer ([place])   . Hypertension     Patient Active Problem List   Diagnosis Date Noted  . Syncope 06/02/2015    Past Surgical History:  Procedure Laterality Date  . CERVICAL CONE BIOPSY  10/2010   cervical biopsy, cystoscopy, vaginal packing  . CERVIX REMOVAL    . DILATION AND CURETTAGE OF UTERUS    . 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 Removal of Bakri Balloon  10/2010    Prior to Admission medications   Medication Sig Start Date End Date Taking? Authorizing Provider  aspirin EC 81 MG EC tablet Take 1 tablet (81 mg total) by mouth daily. 06/03/15   Bettey Costa, MD  aspirin EC 81 MG tablet Take 81 mg by mouth daily.    Historical Provider, MD  gabapentin (NEURONTIN) 300 MG capsule Take 1 capsule (300 mg total) by mouth 3 (three) times daily. 01/26/16 01/25/17  Pierce Crane Karimah Winquist, PA-C  hydrochlorothiazide (HYDRODIURIL) 25 MG tablet Take 1 tablet (25 mg total) by mouth daily. 06/03/15   Bettey Costa, MD  ibuprofen (ADVIL,MOTRIN) 800 MG tablet Take 1 tablet (800 mg total) by mouth every 8 (eight) hours as needed. 01/26/16   Pierce Crane Decoda Van, PA-C  metoprolol tartrate (LOPRESSOR) 25 MG tablet Take 1 tablet (25 mg total) by mouth 2 (two) times daily. 06/03/15   Sital Mody, MD  nicotine (NICODERM CQ - DOSED IN MG/24 HOURS) 21 mg/24hr patch Place 1 patch (21 mg total) onto the  skin daily. 06/03/15   Bettey Costa, MD    Allergies Codeine; Demerol [meperidine]; and Penicillins  Family History  Problem Relation Age of Onset  . Hypertension Mother   . Arrhythmia Mother   . CAD Father     Social History Social History  Substance Use Topics  . Smoking status: Current Every Day Smoker    Packs/day: 1.00    Types: Cigarettes  . Smokeless tobacco: Not on file  . Alcohol use No    Review of Systems Constitutional: No fever/chills Cardiovascular: Denies chest pain. Respiratory: Denies shortness of breath. Genitourinary: Negative for dysuria. Musculoskeletal: Positive for low back pain with numbness and tingling to her buttocks and feet. Skin: Negative for rash. Neurological: Negative for headaches, focal weakness or numbness.  10-point ROS otherwise negative.  ____________________________________________   PHYSICAL EXAM:  VITAL SIGNS: ED Triage Vitals  Enc Vitals Group     BP 01/26/16 1017 118/77     Pulse Rate 01/26/16 1017 61     Resp 01/26/16 1017 18     Temp 01/26/16 1017 97.7 F (36.5 C)     Temp Source 01/26/16 1017 Oral     SpO2 01/26/16 1017 100 %     Weight 01/26/16 1015 150 lb (68 kg)     Height 01/26/16 1015 5\' 6"  (1.676 m)     Head Circumference --  Peak Flow --      Pain Score 01/26/16 1016 10     Pain Loc --      Pain Edu? --      Excl. in [place]? --     Constitutional: Alert and oriented. Well appearing and in no acute distress.   Cardiovascular: Normal rate, regular rhythm. Grossly normal heart sounds.  Good peripheral circulation. Respiratory: Normal respiratory effort.  No retractions. Lungs CTAB. Musculoskeletal: No lower extremity tenderness nor edema.  No joint effusions. Neurologic:  Normal speech and language. No gross focal neurologic deficits are appreciated. No gait instability. Skin:  Skin is warm, dry and intact. No rash noted. Psychiatric: Mood and affect are normal. Speech and behavior are  normal.  ____________________________________________   LABS (all labs ordered are listed, but only abnormal results are displayed)  Labs Reviewed - No data to display ____________________________________________  EKG   ____________________________________________  RADIOLOGY  Deferred at this time. ____________________________________________   PROCEDURES  Procedure(s) performed: None  Critical Care performed: No  ____________________________________________   INITIAL IMPRESSION / ASSESSMENT AND PLAN / ED COURSE  Pertinent labs & imaging results that were available during my care of the patient were reviewed by me and considered in my medical decision making (see chart for details). Review of the [place] CSRS was performed in accordance of the [place] prior to dispensing any controlled drugs.  Acute exacerbation of sciatica. Rx given for gabapentin 300 mg 3 times a day, ibuprofen 800 mg 3 times a day. Patient follow-up with PCP list of providers given and pain management.  Clinical Course    ____________________________________________   FINAL CLINICAL IMPRESSION(S) / ED DIAGNOSES  Final diagnoses:  Bilateral sciatica     This chart was dictated using voice recognition software/Dragon. Despite best efforts to proofread, errors can occur which can change the meaning. Any change was purely unintentional.    Arlyss Repress, PA-C 01/26/16 [place], MD 01/26/16 [phone]

## 2016-10-02 IMAGING — CT CT HEAD W/O CM
1 series · 16 of 30 positions shown, 20 images · non-contrast
Comparison: Head CT scan 03/05/2014.

CLINICAL DATA: Syncope preceded by dizziness, blurred vision and
heart racing today. Initial encounter.

EXAM:
CT HEAD WITHOUT CONTRAST
TECHNIQUE: Contiguous axial images were obtained from the base of the skull
through the vertex without intravenous contrast.

[Series 2: head wo · axial · 0.39mm/px · z∈[+411,+537]mm · 16 of 32 slices shown, 20 images]
[im 2/32  brain]
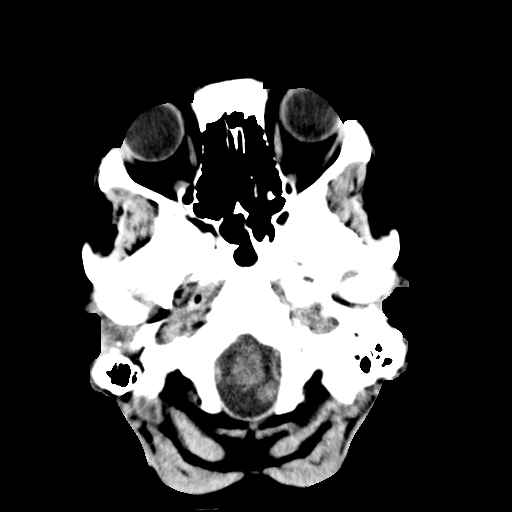
[im 2/32  bone]
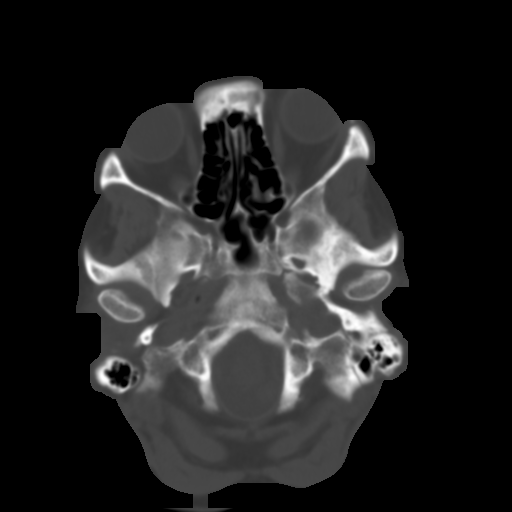
[im 4/32  brain]
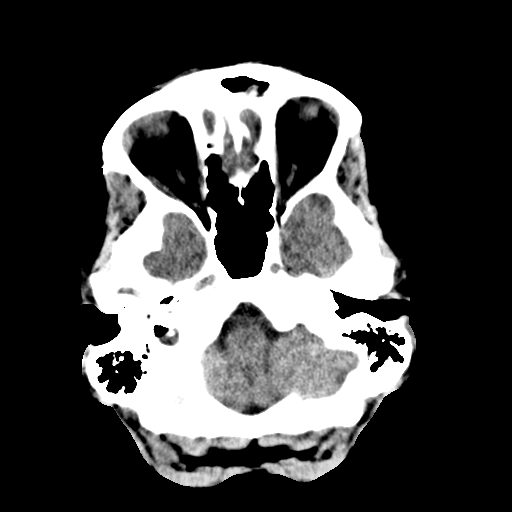
[im 6/32  brain]
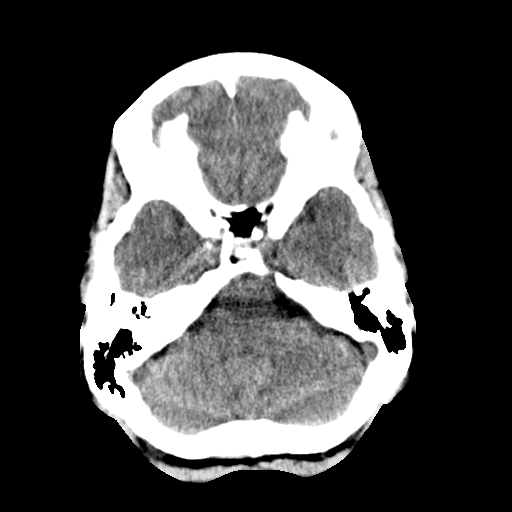
[im 8/32  brain]
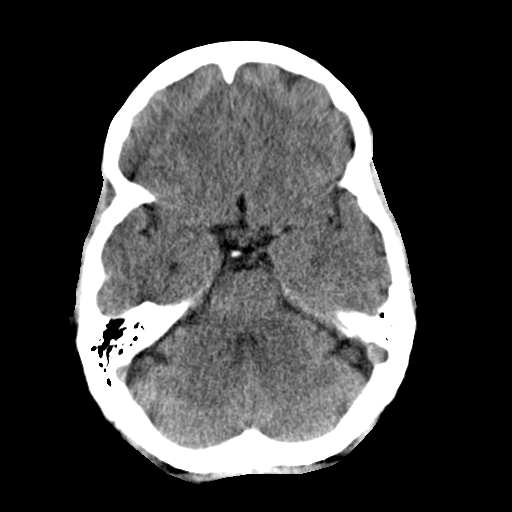
[im 9/32  brain]
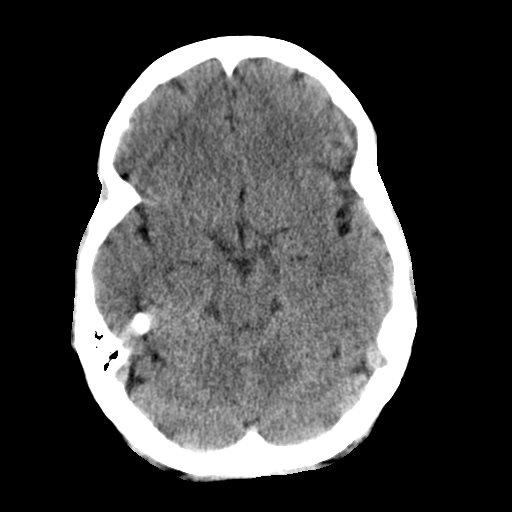
[im 9/32  bone]
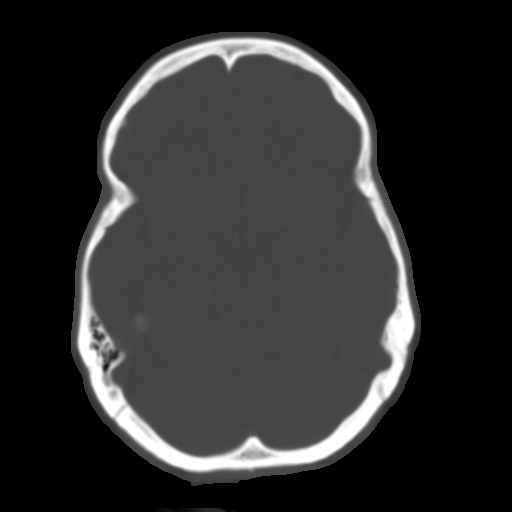
[im 11/32  brain]
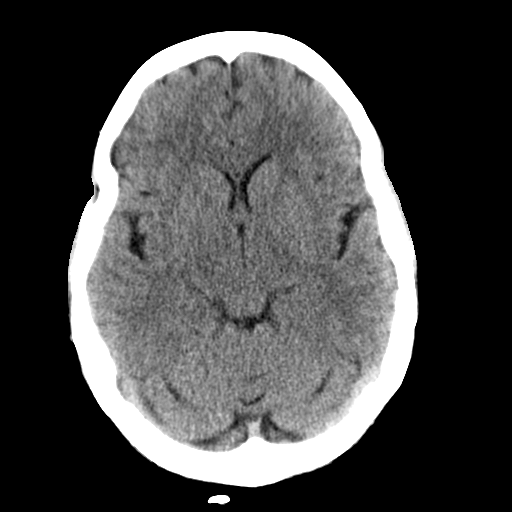
[im 13/32  brain]
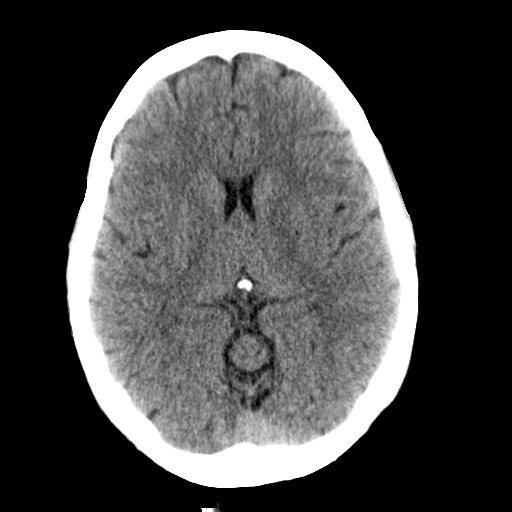
[im 15/32  brain]
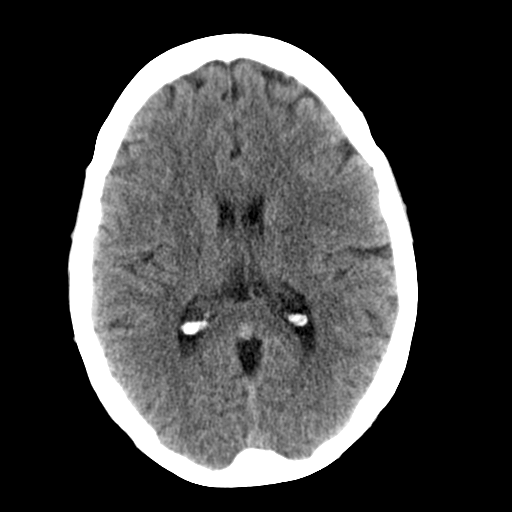
[im 17/32  brain]
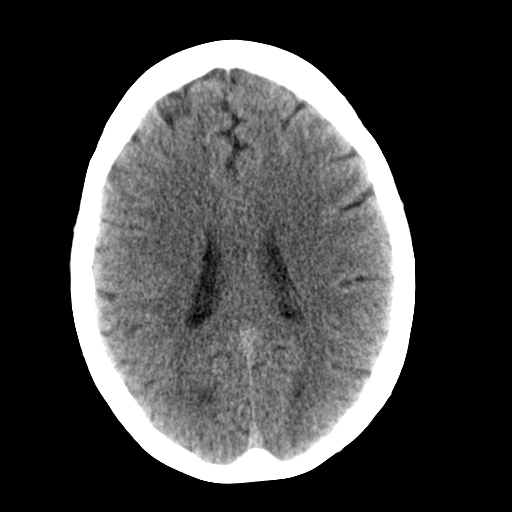
[im 17/32  bone]
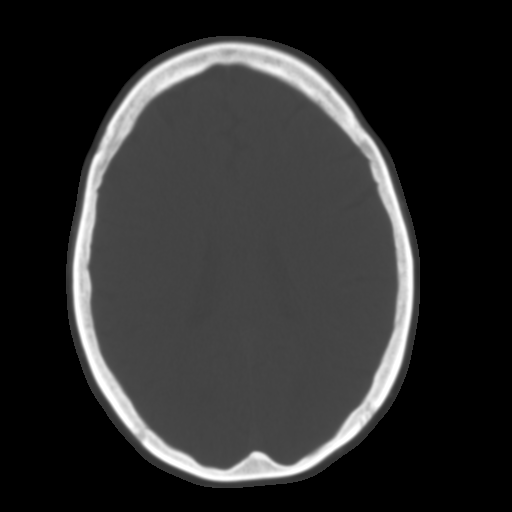
[im 19/32  brain]
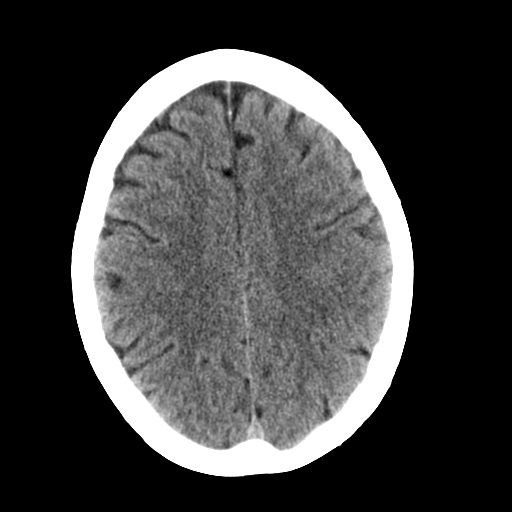
[im 21/32  brain]
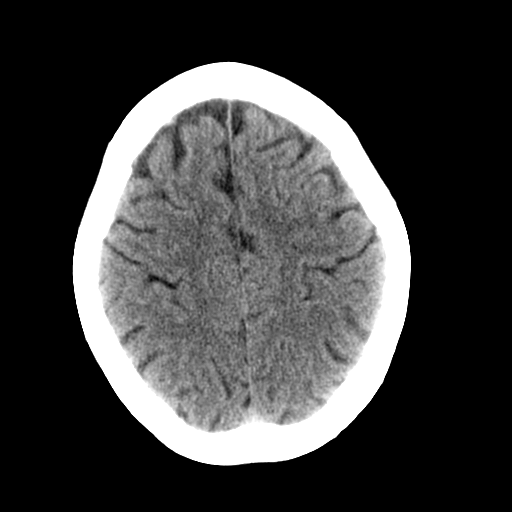
[im 23/32  brain]
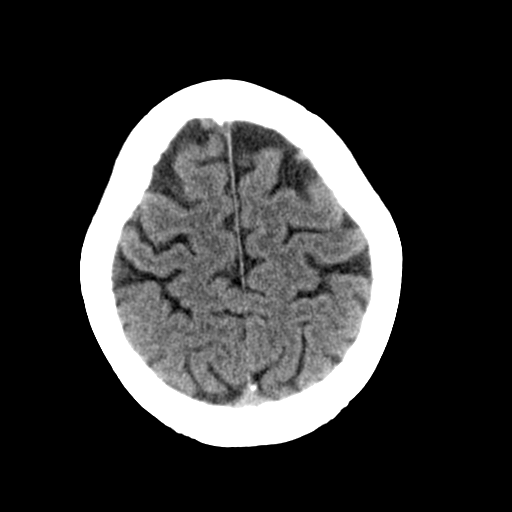
[im 24/32  brain]
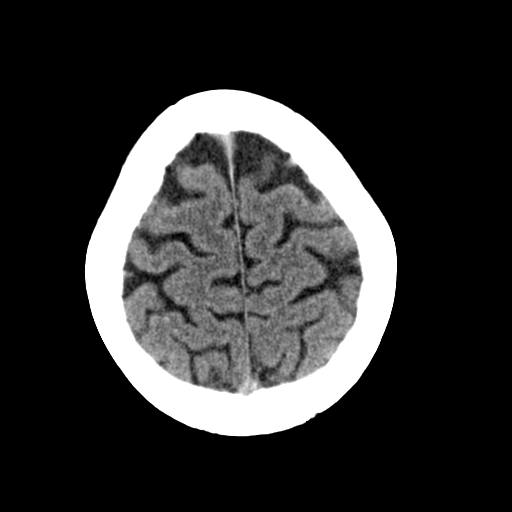
[im 24/32  bone]
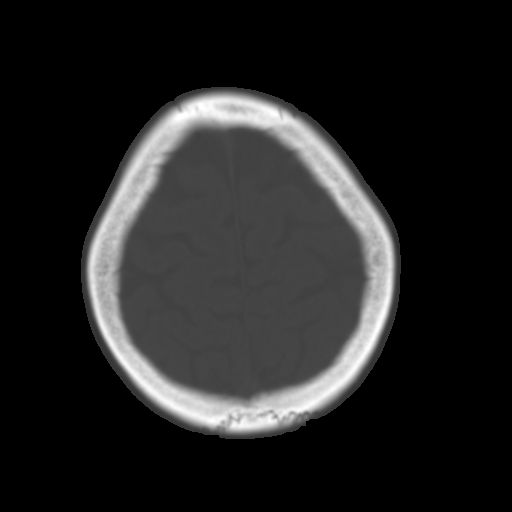
[im 26/32  brain]
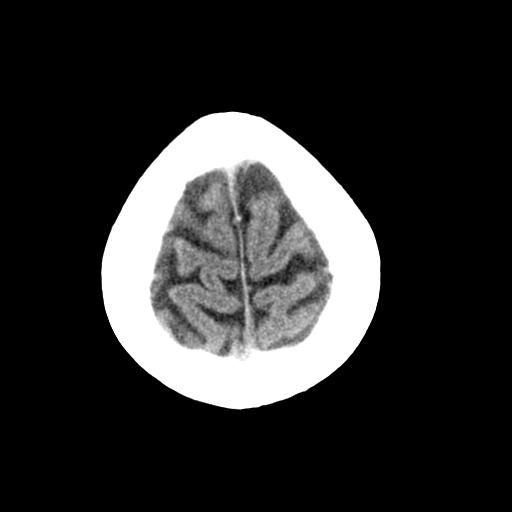
[im 28/32  brain]
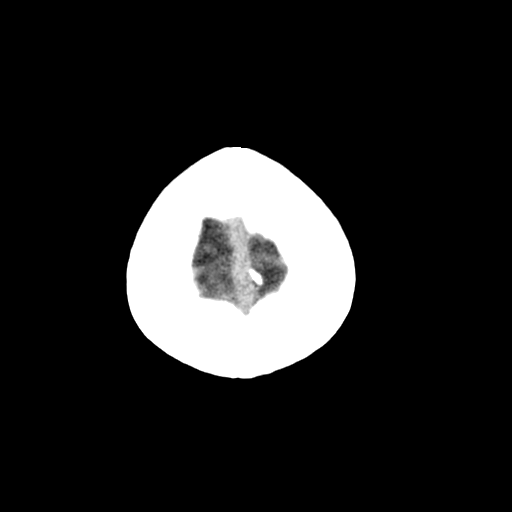
[im 30/32  brain]
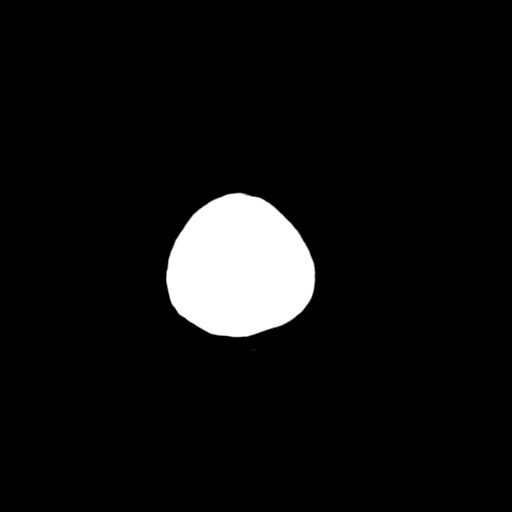

[16 of 30 positions shown; findings below may reference images not displayed]

FINDINGS: The brain appears normal without hemorrhage, infarct, mass lesion,
mass effect, midline shift or abnormal extra-axial fluid collection.
No hydrocephalus or pneumocephalus. The calvarium is intact. Imaged
paranasal sinuses and mastoid air cells are clear.
IMPRESSION: Normal head CT.

## 2017-04-21 ENCOUNTER — Encounter: Payer: Self-pay | Admitting: Intensive Care

## 2017-04-21 ENCOUNTER — Emergency Department
Admission: EM | Admit: 2017-04-21 | Discharge: 2017-04-21 | Disposition: A | Discharge status: Home / Self Care | Payer: BLUE CROSS/BLUE SHIELD | Attending: Emergency Medicine | Admitting: Emergency Medicine

## 2017-04-21 DIAGNOSIS — Z79899 Other long term (current) drug therapy: Secondary | ICD-10-CM | POA: Insufficient documentation

## 2017-04-21 DIAGNOSIS — I10 Essential (primary) hypertension: Secondary | ICD-10-CM | POA: Insufficient documentation

## 2017-04-21 DIAGNOSIS — F17210 Nicotine dependence, cigarettes, uncomplicated: Secondary | ICD-10-CM | POA: Insufficient documentation

## 2017-04-21 DIAGNOSIS — M545 Low back pain, unspecified: Secondary | ICD-10-CM

## 2017-04-21 DIAGNOSIS — S32059S Unspecified fracture of fifth lumbar vertebra, sequela: Secondary | ICD-10-CM | POA: Diagnosis not present

## 2017-04-21 DIAGNOSIS — X58XXXS Exposure to other specified factors, sequela: Secondary | ICD-10-CM | POA: Diagnosis not present

## 2017-04-21 DIAGNOSIS — Z8541 Personal history of malignant neoplasm of cervix uteri: Secondary | ICD-10-CM | POA: Diagnosis not present

## 2017-04-21 MED ORDER — OXYCODONE-ACETAMINOPHEN 5-325 MG PO TABS: 1.0000 | ORAL_TABLET | Freq: Four times a day (QID) | ORAL | 0 refills | Status: AC | PRN

## 2017-04-21 NOTE — Discharge Instructions (Signed)
Advised to contact treating orthopedics for pain medication pending MRI approval

## 2017-04-21 NOTE — ED Triage Notes (Signed)
Patient presents today with lower back pain. Diagnosed with fracture in the fifth lumbar vertebra 04/13/17. Reports MD kalman from [facility] diagnosed her and is suppose to be setting up MRI appointment and she came in today bc she cannot take the pain anymore. Patient drove here today from leaving work early due to back pain. Patient was given gabapentin and naproxen for pain X1 week with no relief

## 2017-04-21 NOTE — ED Notes (Signed)
Says she has fx vertabrae and is waiting to be approved for mri--seeing [facility].  Has naproxen and gabapentin. Today was at work and the pain is so bad she could not even walk.  The pain is low back and goes down outer left leg with some tingling.  No loss of bladder or bowel function.  Good cap refill bilat legs.

## 2017-04-21 NOTE — ED Provider Notes (Signed)
[facility] Emergency Department Provider Note   ____________________________________________   First MD Initiated Contact with Patient 04/21/17 1424     (approximate)  I have reviewed the triage vital signs and the nursing notes.   HISTORY  Chief Complaint Back Pain    HPI Tina Chang is a 51 y.o. female patient complaining of low back pain secondary to fractured fifth lumbar vertebrae which occurred on 04/13/2017. Patient is followed by [facility] and they're waiting approval for MRI.Patient states she was given a prescription for gabapentin and naproxen for pain as noted no relief with these medications. Patient denies radicular component to this pain he denies bladder or bowel dysfunction.patient is rating the pain as a 10 over 10. Patient described a pain as "sharp". Patient leave work secondary to pain and came to the emergency room. Patient has a copy of x-ray with reported confirming diagnosis of a fractured vertebrae.   Past Medical History:  Diagnosis Date  . Cervical cancer ([place])   . Hypertension     Patient Active Problem List   Diagnosis Date Noted  . Syncope 06/02/2015    Past Surgical History:  Procedure Laterality Date  . CERVICAL CONE BIOPSY  10/2010   cervical biopsy, cystoscopy, vaginal packing  . CERVIX REMOVAL    . DILATION AND CURETTAGE OF UTERUS    . 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 Removal of Bakri Balloon  10/2010    Prior to Admission medications   Medication Sig Start Date End Date Taking? Authorizing Provider  aspirin EC 81 MG EC tablet Take 1 tablet (81 mg total) by mouth daily. 06/03/15   Bettey Costa, MD  aspirin EC 81 MG tablet Take 81 mg by mouth daily.    [provider]  gabapentin (NEURONTIN) 300 MG capsule Take 1 capsule (300 mg total) by mouth 3 (three) times daily. 01/26/16 01/25/17  Beers, Pierce Crane, PA-C  hydrochlorothiazide (HYDRODIURIL) 25 MG tablet  Take 1 tablet (25 mg total) by mouth daily. 06/03/15   Bettey Costa, MD  ibuprofen (ADVIL,MOTRIN) 800 MG tablet Take 1 tablet (800 mg total) by mouth every 8 (eight) hours as needed. 01/26/16   Beers, Pierce Crane, PA-C  metoprolol tartrate (LOPRESSOR) 25 MG tablet Take 1 tablet (25 mg total) by mouth 2 (two) times daily. 06/03/15   Bettey Costa, MD  nicotine (NICODERM CQ - DOSED IN MG/24 HOURS) 21 mg/24hr patch Place 1 patch (21 mg total) onto the skin daily. 06/03/15   Bettey Costa, MD  oxyCODONE-acetaminophen (ROXICET) 5-325 MG tablet Take 1 tablet by mouth every 6 (six) hours as needed. 04/21/17 04/21/18  Sable Feil, PA-C    Allergies Codeine; Demerol [meperidine]; and Penicillins  Family History  Problem Relation Age of Onset  . Hypertension Mother   . Arrhythmia Mother   . CAD Father     Social History Social History   Tobacco Use  . Smoking status: Current Every Day Smoker    Packs/day: 1.00    Types: Cigarettes  . Smokeless tobacco: Never Used  Substance Use Topics  . Alcohol use: Yes    Comment: occ  . Drug use: No    Review of Systems Constitutional: No fever/chills Eyes: No visual changes. ENT: No sore throat. Cardiovascular: Denies chest pain. Respiratory: Denies shortness of breath. Gastrointestinal: No abdominal pain.  No nausea, no vomiting.  No diarrhea.  No constipation. Genitourinary: Negative for dysuria. Musculoskeletal: positivefor back pain.  Skin: Negative for rash. Neurological: Negative for headaches, focal weakness or numbness. Endocrine:hypertension Allergic/Immunilogical: codeine, Demerol, and penicillin. ____________________________________________   PHYSICAL EXAM:  VITAL SIGNS: ED Triage Vitals [04/21/17 1244]  Enc Vitals Group     BP (!) 164/86     Pulse Rate (!) 110     Resp 18     Temp 97.8 F (36.6 C)     Temp Source Oral     SpO2 98 %     Weight 140 lb (63.5 kg)     Height 5\' 6"  (1.676 m)     Head Circumference      Peak Flow       Pain Score 10     Pain Loc      Pain Edu?      Excl. in [place]?    Constitutional: Alert and oriented. Well appearing and in no acute distress. Cardiovascular: ,. Grossly normal heart sounds.  Good peripheral circulation with elevated blood pressure Respiratory: Normal respiratory effort.  No retractions. Lungs CTAB. Gastrointestinal: Soft and nontender. No distention. No abdominal bruits. No CVA tenderness. Musculoskeletal:no obvious spinal deformity. Patient moderate guarding palpation of L3-S1. Negative straight leg test.. Neurologic:  Normal speech and language. No gross focal neurologic deficits are appreciated. No gait instability. Skin:  Skin is warm, dry and intact. No rash noted. Psychiatric: Mood and affect are normal. Speech and behavior are normal.  ____________________________________________   LABS (all labs ordered are listed, but only abnormal results are displayed)  Labs Reviewed - No data to display ____________________________________________  EKG   ____________________________________________  RADIOLOGY  No results found.  ____________________________________________   PROCEDURES  Procedure(s) performed: None  Procedures  Critical Care performed: No  ____________________________________________   INITIAL IMPRESSION / ASSESSMENT AND PLAN / ED COURSE  As part of my medical decision making, I reviewed the following data within the electronic MEDICAL RECORD NUMBER Notes from prior ED visits and [place] Controlled Substance Database   Acute low back pain secondary to lumbar fracture. Patient given discharge Instructions and prescription for Percocets. Patient advised follow-up for treating orthopedist for continued pain control pending MRI approval.      ____________________________________________   FINAL CLINICAL IMPRESSION(S) / ED DIAGNOSES  Final diagnoses:  Acute midline low back pain without sciatica     ED Discharge Orders        Ordered     oxyCODONE-acetaminophen (ROXICET) 5-325 MG tablet  Every 6 hours PRN     04/21/17 1432       Note:  This document was prepared using Dragon voice recognition software and may include unintentional dictation errors.    Sable Feil, PA-C 04/21/17 1440    Lavonia Drafts, MD 04/21/17 [phone]

## 2019-02-05 ENCOUNTER — Ambulatory Visit: Payer: Self-pay | Admitting: Family Medicine

## 2019-02-05 ENCOUNTER — Encounter: Payer: Self-pay | Admitting: Family Medicine

## 2019-02-05 ENCOUNTER — Other Ambulatory Visit: Payer: Self-pay | Admitting: Family Medicine

## 2019-02-05 ENCOUNTER — Other Ambulatory Visit: Payer: Self-pay

## 2019-02-05 VITALS — BP 138/70 | HR 76 | Temp 98.9°F | Resp 16 | Ht 66.0 in | Wt 127.0 lb

## 2019-02-05 DIAGNOSIS — Z720 Tobacco use: Secondary | ICD-10-CM | POA: Diagnosis not present

## 2019-02-05 DIAGNOSIS — Z1159 Encounter for screening for other viral diseases: Secondary | ICD-10-CM

## 2019-02-05 DIAGNOSIS — I10 Essential (primary) hypertension: Secondary | ICD-10-CM

## 2019-02-05 DIAGNOSIS — Z0000 Encounter for general adult medical examination without abnormal findings: Secondary | ICD-10-CM

## 2019-02-05 MED ORDER — LISINOPRIL 20 MG PO TABS: 20.0000 mg | ORAL_TABLET | Freq: Every day | ORAL | 1 refills | Status: DC

## 2019-02-05 MED ORDER — METOPROLOL TARTRATE 25 MG PO TABS: 25.0000 mg | ORAL_TABLET | Freq: Two times a day (BID) | ORAL | 1 refills | Status: DC

## 2019-02-05 NOTE — Progress Notes (Signed)
Subjective:    Patient ID: Tina Chang, female    DOB: 31-Oct-1965, 53 y.o.   MRN: [id number]  Tina Chang is a 53 y.o. female presenting on 02/05/2019 for [place] (went to [facility] urgent care in past now meds refill for HTN) and Hypertension  Previously at [facility], followed by Othelia Pulling PA.  HPI   CHRONIC HTN: Reports she has had HBP since 2012 with cancer diagnosis, checks BP at home usually SBP 140-150s / 70-80s Current Meds - Lisinopril 10mg  daily, Metoprolol Tartrate 25mg  BID - Off HCTZ 25mg  daily (for past 2 months) Reports good compliance, took meds today. Tolerating well, w/o complaints. Tobacco abuse with chronic smoking 1/2 ppd for 20+years Denies CP, dyspnea, HA, edema, dizziness / lightheadedness  History Cervical Cancer History diagnosed cervical cancer back in 2012, she had vaginal bleeding and hemorrhaging, required acute intervention to stop bleeding, transferred to [facility] for surgical removal of cervix, radiation therapy and chemotherapy. Completed 2012. Had been on surveillance since, last visit 2015, now followed local GYN at [facility], Dr Prentice Docker, last pap 2 years ago 2018.  Pap History 1. 10/2010 diagnosis of stage IIIB cervical cancer; 2. 06/2011: Result negative 3. 06/2013: [facility]   Health Maintenance: Due for Flu Shot, declines today despite counseling on benefits  Due for repeat pap - will return to GYN locally  Not interested in colon cancer screening today will consider Cologuard.  Offered Mammogram, defer to next visit  Due for routine Hep C lab - will get with next lab  Depression screen [facility] 2/9 02/05/2019  Decreased Interest 0  Down, Depressed, Hopeless 0  PHQ - 2 Score 0    Past Medical History:  Diagnosis Date  . Cervical cancer ([place])   . Hypertension    Past Surgical History:  Procedure Laterality Date  . CERVICAL CONE BIOPSY  10/2010   cervical biopsy, cystoscopy, vaginal packing  . CERVIX  REMOVAL    . DILATION AND CURETTAGE OF UTERUS    . 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 Removal of Bakri Balloon  10/2010   Social History   Socioeconomic History  . Marital status: Single    Spouse name: Not on file  . Number of children: Not on file  . Years of education: [employer]  . Highest education level: High school graduate  Occupational History  . Not on file  Social Needs  . Financial resource strain: Not on file  . Food insecurity    Worry: Not on file    Inability: Not on file  . Transportation needs    Medical: Not on file    Non-medical: Not on file  Tobacco Use  . Smoking status: Current Every Day Smoker    Packs/day: 0.50    Years: 20.00    Pack years: 10.00    Types: Cigarettes  . Smokeless tobacco: Never Used  Substance and Sexual Activity  . Alcohol use: Yes    Alcohol/week: 3.0 standard drinks    Types: 3 Standard drinks or equivalent per week  . Drug use: No  . Sexual activity: Not on file  Lifestyle  . Physical activity    Days per week: Not on file    Minutes per session: Not on file  . Stress: Not on file  Relationships  . Social Herbalist on phone: Not on file    Gets together: Not on file  Attends religious service: Not on file    Active member of club or organization: Not on file    Attends meetings of clubs or organizations: Not on file    Relationship status: Not on file  . Intimate partner violence    Fear of current or ex partner: Not on file    Emotionally abused: Not on file    Physically abused: Not on file    Forced sexual activity: Not on file  Other Topics Concern  . Not on file  Social History Narrative  . Not on file   Family History  Problem Relation Age of Onset  . Hypertension Mother   . Arrhythmia Mother   . CAD Father   . Diabetes Father   . Stroke Father   . Heart attack Brother 33   Current Outpatient Medications on File Prior to Visit  Medication Sig  .  aspirin EC 81 MG EC tablet Take 1 tablet (81 mg total) by mouth daily.  Marland Kitchen ibuprofen (ADVIL,MOTRIN) 800 MG tablet Take 1 tablet (800 mg total) by mouth every 8 (eight) hours as needed.  . gabapentin (NEURONTIN) 300 MG capsule Take 1 capsule (300 mg total) by mouth 3 (three) times daily.   No current facility-administered medications on file prior to visit.     Review of Systems Per HPI unless specifically indicated above     Objective:    BP 138/70 (BP Location: Left Arm, Cuff Size: Normal)   Pulse 76   Temp 98.9 F (37.2 C) (Oral)   Resp 16   Ht 5\' 6"  (1.676 m)   Wt 127 lb (57.6 kg)   BMI 20.50 kg/m   Wt Readings from Last 3 Encounters:  02/05/19 127 lb (57.6 kg)  04/21/17 140 lb (63.5 kg)  01/26/16 150 lb (68 kg)    Physical Exam Vitals signs and nursing note reviewed.  Constitutional:      General: She is not in acute distress.    Appearance: She is well-developed. She is not diaphoretic.     Comments: Well-appearing, comfortable, cooperative  HENT:     Head: Normocephalic and atraumatic.  Eyes:     General:        Right eye: No discharge.        Left eye: No discharge.     Conjunctiva/sclera: Conjunctivae normal.  Neck:     Musculoskeletal: Normal range of motion and neck supple.     Thyroid: No thyromegaly.  Cardiovascular:     Rate and Rhythm: Normal rate and regular rhythm.     Heart sounds: Normal heart sounds. No murmur.  Pulmonary:     Effort: Pulmonary effort is normal. No respiratory distress.     Breath sounds: Normal breath sounds. No wheezing or rales.  Musculoskeletal: Normal range of motion.  Lymphadenopathy:     Cervical: No cervical adenopathy.  Skin:    General: Skin is warm and dry.     Findings: No erythema or rash.  Neurological:     Mental Status: She is alert and oriented to person, place, and time.  Psychiatric:        Behavior: Behavior normal.     Comments: Well groomed, good eye contact, normal speech and thoughts    Results  for orders placed or performed during the hospital encounter of 06/02/15  CBC  Result Value Ref Range   WBC 8.6 3.6 - 11.0 K/uL   RBC 4.78 3.80 - 5.20 MIL/uL   Hemoglobin 15.5 12.0 -  16.0 g/dL   HCT 46.2 35.0 - 47.0 %   MCV 96.6 80.0 - 100.0 fL   MCH 32.5 26.0 - 34.0 pg   MCHC 33.6 32.0 - 36.0 g/dL   RDW 13.4 11.5 - 14.5 %   Platelets 259 150 - 440 K/uL  Comprehensive metabolic panel  Result Value Ref Range   Sodium 137 135 - 145 mmol/L   Potassium 4.0 3.5 - 5.1 mmol/L   Chloride 101 101 - 111 mmol/L   CO2 25 22 - 32 mmol/L   Glucose, Bld 97 65 - 99 mg/dL   BUN 23 (H) 6 - 20 mg/dL   Creatinine, Ser 0.88 0.44 - 1.00 mg/dL   Calcium 9.9 8.9 - 10.3 mg/dL   Total Protein 8.2 (H) 6.5 - 8.1 g/dL   Albumin 4.6 3.5 - 5.0 g/dL   AST 24 15 - 41 U/L   ALT 16 14 - 54 U/L   Alkaline Phosphatase 62 38 - 126 U/L   Total Bilirubin 0.6 0.3 - 1.2 mg/dL   GFR calc non Af Amer >60 >60 mL/min   GFR calc Af Amer >60 >60 mL/min   Anion gap 11 5 - 15  Troponin I  Result Value Ref Range   Troponin I <0.03 <0.031 ng/mL  Basic metabolic panel  Result Value Ref Range   Sodium 138 135 - 145 mmol/L   Potassium 3.8 3.5 - 5.1 mmol/L   Chloride 102 101 - 111 mmol/L   CO2 27 22 - 32 mmol/L   Glucose, Bld 103 (H) 65 - 99 mg/dL   BUN 23 (H) 6 - 20 mg/dL   Creatinine, Ser 0.99 0.44 - 1.00 mg/dL   Calcium 9.5 8.9 - 10.3 mg/dL   GFR calc non Af Amer >60 >60 mL/min   GFR calc Af Amer >60 >60 mL/min   Anion gap 9 5 - 15  CBC  Result Value Ref Range   WBC 7.6 3.6 - 11.0 K/uL   RBC 4.45 3.80 - 5.20 MIL/uL   Hemoglobin 14.3 12.0 - 16.0 g/dL   HCT 43.1 35.0 - 47.0 %   MCV 96.7 80.0 - 100.0 fL   MCH 32.2 26.0 - 34.0 pg   MCHC 33.3 32.0 - 36.0 g/dL   RDW 13.3 11.5 - 14.5 %   Platelets 257 150 - 440 K/uL      Assessment & Plan:   Problem List Items Addressed This Visit    Essential hypertension - Primary    Mildly elevated initial BP, repeat manual check improved. - Home BP readings reported normal   No known complications Off HCTZ now, may not be needed    Plan:  1. INCREASE Dose Lisinopril from 10 to 20mg  daily, continue Metoprolol Tartrate 25 BID - Reorder meds 2. Encourage improved lifestyle - low sodium diet, regular exercise 3. Start monitor BP outside office, bring readings to next visit, if persistently >140/90 or new symptoms notify office sooner 4. Follow-up 3 months for annual       Relevant Medications   lisinopril (ZESTRIL) 20 MG tablet   metoprolol tartrate (LOPRESSOR) 25 MG tablet   Tobacco abuse      Meds ordered this encounter  Medications  . lisinopril (ZESTRIL) 20 MG tablet    Sig: Take 1 tablet (20 mg total) by mouth daily.    Dispense:  90 tablet    Refill:  1  . metoprolol tartrate (LOPRESSOR) 25 MG tablet    Sig: Take 1 tablet (25  mg total) by mouth 2 (two) times daily.    Dispense:  180 tablet    Refill:  1    Follow up plan: Return in about 3 months (around 05/07/2019) for Annual Physical.   Future labs for 04/2019 including add Hep C  Nobie Putnam, DO [facility] 02/05/2019, 10:50 AM

## 2019-02-05 NOTE — Patient Instructions (Addendum)
Thank you for coming to the office today.  Follow back up with Dr Prentice Docker at [facility] for follow-up pap smear cervical screening  Re ordered Metoprolol  Increase dose Lisinopril from 10 to 20mg   Holding HCTZ still.  Keep track of BP - report back in 3 months.   DUE for FASTING BLOOD WORK (no food or drink after midnight before the lab appointment, only water or coffee without cream/sugar on the morning of)  SCHEDULE "Lab Only" visit in the morning at the clinic for lab draw in 3 MONTHS   - Make sure Lab Only appointment is at about 1 week before your next appointment, so that results will be available  For Lab Results, once available within 2-3 days of blood draw, you can can log in to MyChart online to view your results and a brief explanation. Also, we can discuss results at next follow-up visit.   Please schedule a Follow-up Appointment to: Return in about 3 months (around 05/07/2019) for Annual Physical.  If you have any other questions or concerns, please feel free to call the office or send a message through [place]. You may also schedule an earlier appointment if necessary.  Additionally, you may be receiving a survey about your experience at our office within a few days to 1 week by e-mail or mail. We value your feedback.  Nobie Putnam, DO [place]

## 2019-02-05 NOTE — Assessment & Plan Note (Addendum)
Mildly elevated initial BP, repeat manual check improved. - Home BP readings reported normal  No known complications Off HCTZ now, may not be needed    Plan:  1. INCREASE Dose Lisinopril from 10 to 20mg  daily, continue Metoprolol Tartrate 25 BID - Reorder meds 2. Encourage improved lifestyle - low sodium diet, regular exercise 3. Start monitor BP outside office, bring readings to next visit, if persistently >140/90 or new symptoms notify office sooner 4. Follow-up 3 months for annual

## 2019-04-30 ENCOUNTER — Other Ambulatory Visit: Payer: Self-pay

## 2019-05-07 ENCOUNTER — Encounter: Payer: Self-pay | Admitting: Family Medicine

## 2019-05-14 LAB — COMPLETE METABOLIC PANEL WITH GFR
AG Ratio: 1.6 (calc) (ref 1.0–2.5)
ALT: 14 U/L (ref 6–29)
AST: 21 U/L (ref 10–35)
Albumin: 4 g/dL (ref 3.6–5.1)
Alkaline phosphatase (APISO): 40 U/L (ref 37–153)
BUN/Creatinine Ratio: 26 (calc) — ABNORMAL HIGH (ref 6–22)
BUN: 26 mg/dL — ABNORMAL HIGH (ref 7–25)
CO2: 24 mmol/L (ref 20–32)
Calcium: 9.5 mg/dL (ref 8.6–10.4)
Chloride: 104 mmol/L (ref 98–110)
Creat: 1.01 mg/dL (ref 0.50–1.05)
GFR, Est African American: 73 mL/min/{1.73_m2} (ref >=60)
GFR, Est Non African American: 63 mL/min/{1.73_m2} (ref >=60)
Globulin: 2.5 g/dL (calc) (ref 1.9–3.7)
Glucose, Bld: 90 mg/dL (ref 65–99)
Potassium: 4.7 mmol/L (ref 3.5–5.3)
Sodium: 137 mmol/L (ref 135–146)
Total Bilirubin: 0.3 mg/dL (ref 0.2–1.2)
Total Protein: 6.5 g/dL (ref 6.1–8.1)

## 2019-05-14 LAB — HEMOGLOBIN A1C
Hgb A1c MFr Bld: 5.1 % of total Hgb (ref <5.7)
Mean Plasma Glucose: 100 (calc)
eAG (mmol/L): 5.5 (calc)

## 2019-05-14 LAB — CBC WITH DIFFERENTIAL/PLATELET
Absolute Monocytes: 616 cells/uL (ref 200–950)
Basophils Absolute: 68 cells/uL (ref 0–200)
Basophils Relative: 0.9 %
Eosinophils Absolute: 114 cells/uL (ref 15–500)
Eosinophils Relative: 1.5 %
HCT: 37.1 % (ref 35.0–45.0)
Hemoglobin: 12.5 g/dL (ref 11.7–15.5)
Lymphs Abs: 1277 cells/uL (ref 850–3900)
MCH: 32.9 pg (ref 27.0–33.0)
MCHC: 33.7 g/dL (ref 32.0–36.0)
MCV: 97.6 fL (ref 80.0–100.0)
MPV: 10 fL (ref 7.5–12.5)
Monocytes Relative: 8.1 %
Neutro Abs: 5525 cells/uL (ref 1500–7800)
Neutrophils Relative %: 72.7 %
Platelets: 252 10*3/uL (ref 140–400)
RBC: 3.8 10*6/uL (ref 3.80–5.10)
RDW: 11.4 % (ref 11.0–15.0)
Total Lymphocyte: 16.8 %
WBC: 7.6 10*3/uL (ref 3.8–10.8)

## 2019-05-14 LAB — HEPATITIS C ANTIBODY
Hepatitis C Ab: NONREACTIVE
SIGNAL TO CUT-OFF: 0.02 (ref <1.00)

## 2019-05-14 LAB — LIPID PANEL
Cholesterol: 179 mg/dL (ref <200)
HDL: 77 mg/dL (ref >=50)
LDL Cholesterol (Calc): 83 mg/dL (calc)
Non-HDL Cholesterol (Calc): 102 mg/dL (calc) (ref <130)
Total CHOL/HDL Ratio: 2.3 (calc) (ref <5.0)
Triglycerides: 96 mg/dL (ref <150)

## 2019-05-14 LAB — TSH: TSH: 0.72 mIU/L

## 2019-05-15 ENCOUNTER — Other Ambulatory Visit: Payer: Self-pay

## 2019-05-15 ENCOUNTER — Encounter: Payer: Self-pay | Admitting: Family Medicine

## 2019-05-15 ENCOUNTER — Ambulatory Visit (INDEPENDENT_AMBULATORY_CARE_PROVIDER_SITE_OTHER): Payer: BC Managed Care – PPO | Admitting: Family Medicine

## 2019-05-15 VITALS — BP 131/64 | HR 84 | Temp 97.5°F | Ht 66.0 in | Wt 128.8 lb

## 2019-05-15 DIAGNOSIS — G8929 Other chronic pain: Secondary | ICD-10-CM

## 2019-05-15 DIAGNOSIS — Z0000 Encounter for general adult medical examination without abnormal findings: Secondary | ICD-10-CM

## 2019-05-15 DIAGNOSIS — Z720 Tobacco use: Secondary | ICD-10-CM

## 2019-05-15 DIAGNOSIS — Z1211 Encounter for screening for malignant neoplasm of colon: Secondary | ICD-10-CM | POA: Diagnosis not present

## 2019-05-15 DIAGNOSIS — I10 Essential (primary) hypertension: Secondary | ICD-10-CM | POA: Diagnosis not present

## 2019-05-15 DIAGNOSIS — M5442 Lumbago with sciatica, left side: Secondary | ICD-10-CM

## 2019-05-15 MED ORDER — PREDNISONE 10 MG PO TABS: ORAL_TABLET | ORAL | 0 refills | Status: DC

## 2019-05-15 MED ORDER — GABAPENTIN 300 MG PO CAPS: 300.0000 mg | ORAL_CAPSULE | Freq: Three times a day (TID) | ORAL | 2 refills | Status: DC

## 2019-05-15 NOTE — Assessment & Plan Note (Signed)
Controlled HTN - self reduced lisinopril since last visit - Home BP readings reported normal  No known complications Off HCTZ now, may not be needed    Plan:  1. Agree with reduced dose Lisionpril back to 10mg  daily (half of 20mg ) - will order new 10mg  tab when requested 2. Encourage improved lifestyle - low sodium diet, regular exercise 3. Continue monitor BP outside office, bring readings to next visit, if persistently >140/90 or new symptoms notify office sooner  Yearly

## 2019-05-15 NOTE — Assessment & Plan Note (Signed)
Counseling on smoking cessation Not ready to quit Return when ready

## 2019-05-15 NOTE — Patient Instructions (Addendum)
Thank you for coming to the office today.  Notify us if want any assistance quitting smoking or other issues if BP is elevated or need adjustment  Continue on half dose Lisinopril, notify us when ready for new order can change it to 10mg  pill once daily.  Start Prednisone for sciatica back pain, taper over 6 days  Start Gabapentin 300mg  capsules, take at night for 2-3 nights only, and then increase to 2 times a day for a few days, and then may increase to 3 times a day, it may make you drowsy, if helps significantly at night only, then you can increase instead to 3 capsules at night, instead of 3 times a day - In the future if needed, we can significantly increase the dose if tolerated well, some common doses are 300mg  three times a day up to 600mg  three times a day, usually it takes several weeks or months to get to higher doses  Recommend to start taking Tylenol Extra Strength 500mg  tabs - take 1 to 2 tabs per dose (max 1000mg ) every 6-8 hours for pain (take regularly, don't skip a dose for next 7 days), max 24 hour daily dose is 6 tablets or 3000mg . In the future you can repeat the same everyday Tylenol course for 1-2 weeks at a time.   If interested you can qualify for a screening for lung cancer with a CT scan.  Low Dose Chest CT Lung CA Screening - Age 55-74 - Annual screening for 15 years after quit date  [facility]) Burgess Estelle, RN, OCN [phone]  [place] Smoking Cessation Class  Colon Cancer Screening: - For all adults age 56+ routine colon cancer screening is highly recommended.     - Recent guidelines from [place] recommend starting age of 79 - Early detection of colon cancer is important, because often there are no warning signs or symptoms, also if found early usually it can be cured. Late stage is hard to treat.  - If you are not interested in Colonoscopy screening (if done and normal you could be cleared for 5 to 10  years until next due), then Cologuard is an excellent alternative for screening test for Colon Cancer. It is highly sensitive for detecting DNA of colon cancer from even the earliest stages. Also, there is NO bowel prep required. - If Cologuard is NEGATIVE, then it is good for 3 years before next due - If Cologuard is POSITIVE, then it is strongly advised to get a Colonoscopy, which allows the GI doctor to locate the source of the cancer or polyp (even very early stage) and treat it by removing it. ------------------------- [employer]  Follow instructions to collect sample, you may call the company for any help or questions, 24/7 telephone support at [phone].   Please schedule a Follow-up Appointment to: Return in about 1 year (around 05/14/2020) for Annual Physical.  If you have any other questions or concerns, please feel free to call the office or send a message through [place]. You may also schedule an earlier appointment if necessary.  Additionally, you may be receiving a survey about your experience at our office within a few days to 1 week by e-mail or mail. We value your feedback.  Nobie Putnam, DO [facility], [facility]             Low Back Pain Exercises  See other page with pictures of each exercise.  Start with 1 or  2 of these exercises that you are most comfortable with. Do not do any exercises that cause you significant worsening pain. Some of these may cause some "stretching soreness" but it should go away after you stop the exercise, and get better over time. Gradually increase up to 3-4 exercises as tolerated.  Standing hamstring stretch: Place the heel of your leg on a stool about 15 inches high. Keep your knee straight. Lean forward, bending at the hips until you feel a mild stretch in the back of your thigh. Make sure you do not roll your shoulders and bend at the waist when doing this or you will stretch your lower back  instead. Hold the stretch for 15 to 30 seconds. Repeat 3 times. Repeat the same stretch on your other leg.  Cat and camel: Get down on your hands and knees. Let your stomach sag, allowing your back to curve downward. Hold this position for 5 seconds. Then arch your back and hold for 5 seconds. Do 3 sets of 10.  Quadriped Arm/Leg Raises: Get down on your hands and knees. Tighten your abdominal muscles to stiffen your spine. While keeping your abdominals tight, raise one arm and the opposite leg away from you. Hold this position for 5 seconds. Lower your arm and leg slowly and alternate sides. Do this 10 times on each side.  Pelvic tilt: Lie on your back with your knees bent and your feet flat on the floor. Tighten your abdominal muscles and push your lower back into the floor. Hold this position for 5 seconds, then relax. Do 3 sets of 10.  Partial curl: Lie on your back with your knees bent and your feet flat on the floor. Tighten your stomach muscles and flatten your back against the floor. Tuck your chin to your chest. With your hands stretched out in front of you, curl your upper body forward until your shoulders clear the floor. Hold this position for 3 seconds. Don't hold your breath. It helps to breathe out as you lift your shoulders up. Relax. Repeat 10 times. Build to 3 sets of 10. To challenge yourself, clasp your hands behind your head and keep your elbows out to the side.  Lower trunk rotation: Lie on your back with your knees bent and your feet flat on the floor. Tighten your abdominal muscles and push your lower back into the floor. Keeping your shoulders down flat, gently rotate your legs to one side, then the other as far as you can. Repeat 10 to 20 times.  Single knee to chest stretch: Lie on your back with your legs straight out in front of you. Bring one knee up to your chest and grasp the back of your thigh. Pull your knee toward your chest, stretching your buttock muscle. Hold this  position for 15 to 30 seconds and return to the starting position. Repeat 3 times on each side.  Double knee to chest: Lie on your back with your knees bent and your feet flat on the floor. Tighten your abdominal muscles and push your lower back into the floor. Pull both knees up to your chest. Hold for 5 seconds and repeat 10 to 20 times.

## 2019-05-15 NOTE — Progress Notes (Signed)
Subjective:    Patient ID: Tina Chang, female    DOB: 12/15/1965, 53 y.o.   MRN: [id number]  Tina Chang is a 53 y.o. female presenting on 05/15/2019 for Annual Exam (duscuss lab results)   HPI  Here for Annual Physical and Lab Review.  Labs show normal A1c 5.1, normal cholesterol fasting, no other concerns identified.  CHRONIC HTN: Last visit she was increased on Lisinopril from 10 to 20mg , but did not tolerate this was causing her to be lightheaded at times and dizzy, she reduced down to half tab and has done much better Current Meds - Lisinopril 10mg  daily (currently HALF of 20mg ), Metoprolol Tartrate 25mg  BID - Remains off HCTZ Reports good compliance, took meds today. Tolerating well, w/o complaints. Denies CP, dyspnea, HA, edema, dizziness / lightheadedness  Tobacco abuse Chronic smoking 1/2 ppd for 20+years She is not ready to quit. But wants to eventually Not interested in lung cancer screen with CT scan.  Additional history Lumbar DJD with sciatica History previous orthopedic, through [facility] 2018-2019 for this back pain and sciatica, eventually treated with epidural back injection with good results but had high cost treatment overall. She did take gabapentin in past and asking about trial back on that for sciatica.   Prior history  History Cervical Cancer History diagnosed cervical cancer back in 2012, she had vaginal bleeding and hemorrhaging, required acute intervention to stop bleeding, transferred to [facility] for surgical removal of cervix, radiation therapy and chemotherapy. Completed 2012. Had been on surveillance since, last visit 2015, now followed local GYN at [facility], Dr Prentice Docker, last pap 2 years ago 2018.  Pap History 1. 10/2010 diagnosis of stage IIIB cervical cancer; 2. 06/2011: Result negative 3. 06/2013: [facility]   Health Maintenance: Due for Flu Shot, declines today despite counseling on benefits  Colon CA Screen: No  prior colonoscopy or screening. She agrees to cologuard testing today.  Offered Mammogram, defer now, she declines despite counseling. She will do self checks.  Negative Hep C on blood test.   Depression screen [facility] 2/9 02/05/2019  Decreased Interest 0  Down, Depressed, Hopeless 0  PHQ - 2 Score 0    Past Medical History:  Diagnosis Date  . Cervical cancer ([place])   . Hypertension    Past Surgical History:  Procedure Laterality Date  . CERVICAL CONE BIOPSY  10/2010   cervical biopsy, cystoscopy, vaginal packing  . CERVIX REMOVAL    . DILATION AND CURETTAGE OF UTERUS    . 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 Removal of Bakri Balloon  10/2010   Social History   Socioeconomic History  . Marital status: Single    Spouse name: Not on file  . Number of children: Not on file  . Years of education: [employer]  . Highest education level: High school graduate  Occupational History  . Not on file  Tobacco Use  . Smoking status: Current Every Day Smoker    Packs/day: 0.50    Years: 20.00    Pack years: 10.00    Types: Cigarettes  . Smokeless tobacco: Never Used  Substance and Sexual Activity  . Alcohol use: Yes    Alcohol/week: 3.0 standard drinks    Types: 3 Standard drinks or equivalent per week  . Drug use: No  . Sexual activity: Not on file  Other Topics Concern  . Not on file  Social History Narrative  . Not  on file   Social Determinants of Health   Financial Resource Strain:   . Difficulty of Paying Living Expenses: Not on file  Food Insecurity:   . Worried About Charity fundraiser in the Last Year: Not on file  . Ran Out of Food in the Last Year: Not on file  Transportation Needs:   . Lack of Transportation (Medical): Not on file  . Lack of Transportation (Non-Medical): Not on file  Physical Activity:   . Days of Exercise per Week: Not on file  . Minutes of Exercise per Session: Not on file  Stress:   . Feeling of Stress :  Not on file  Social Connections:   . Frequency of Communication with Friends and Family: Not on file  . Frequency of Social Gatherings with Friends and Family: Not on file  . Attends Religious Services: Not on file  . Active Member of Clubs or Organizations: Not on file  . Attends Archivist Meetings: Not on file  . Marital Status: Not on file  Intimate Partner Violence:   . Fear of Current or Ex-Partner: Not on file  . Emotionally Abused: Not on file  . Physically Abused: Not on file  . Sexually Abused: Not on file   Family History  Problem Relation Age of Onset  . Hypertension Mother   . Arrhythmia Mother   . CAD Father   . Diabetes Father   . Stroke Father   . Heart attack Brother 33  . Breast cancer Neg Hx    Current Outpatient Medications on File Prior to Visit  Medication Sig  . aspirin EC 81 MG EC tablet Take 1 tablet (81 mg total) by mouth daily.  Marland Kitchen lisinopril (ZESTRIL) 20 MG tablet Take 0.5 tablets (10 mg total) by mouth daily.  . metoprolol tartrate (LOPRESSOR) 25 MG tablet Take 1 tablet (25 mg total) by mouth 2 (two) times daily.  Marland Kitchen ibuprofen (ADVIL,MOTRIN) 800 MG tablet Take 1 tablet (800 mg total) by mouth every 8 (eight) hours as needed. (Patient not taking: Reported on 05/15/2019)   No current facility-administered medications on file prior to visit.    Review of Systems  Constitutional: Negative for activity change, appetite change, chills, diaphoresis, fatigue and fever.  HENT: Negative for congestion and hearing loss.   Eyes: Negative for visual disturbance.  Respiratory: Negative for apnea, cough, chest tightness, shortness of breath and wheezing.   Cardiovascular: Negative for chest pain, palpitations and leg swelling.  Gastrointestinal: Negative for abdominal pain, anal bleeding, blood in stool, constipation, diarrhea, nausea and vomiting.  Endocrine: Negative for cold intolerance.  Genitourinary: Negative for dysuria, frequency and hematuria.    Musculoskeletal: Negative for arthralgias, back pain and neck pain.  Skin: Negative for rash.  Allergic/Immunologic: Negative for environmental allergies.  Neurological: Negative for dizziness, weakness, light-headedness, numbness and headaches.  Hematological: Negative for adenopathy.  Psychiatric/Behavioral: Negative for behavioral problems, dysphoric mood and sleep disturbance. The patient is not nervous/anxious.    Per HPI unless specifically indicated above     Objective:    BP 131/64 (BP Location: Right Arm, Patient Position: Sitting, Cuff Size: Small)   Pulse 84   Temp (!) 97.5 F (36.4 C) (Oral)   Ht 5\' 6"  (1.676 m)   Wt 128 lb 12.8 oz (58.4 kg)   BMI 20.79 kg/m   Wt Readings from Last 3 Encounters:  05/15/19 128 lb 12.8 oz (58.4 kg)  02/05/19 127 lb (57.6 kg)  04/21/17 140 lb (63.5  kg)    Physical Exam Vitals and nursing note reviewed.  Constitutional:      General: She is not in acute distress.    Appearance: She is well-developed. She is not diaphoretic.     Comments: Well-appearing, comfortable, cooperative, thin body habitus  HENT:     Head: Normocephalic and atraumatic.  Eyes:     General:        Right eye: No discharge.        Left eye: No discharge.     Conjunctiva/sclera: Conjunctivae normal.     Pupils: Pupils are equal, round, and reactive to light.  Neck:     Thyroid: No thyromegaly.     Comments: No carotid bruits Cardiovascular:     Rate and Rhythm: Normal rate and regular rhythm.     Heart sounds: Normal heart sounds. No murmur.  Pulmonary:     Effort: Pulmonary effort is normal. No respiratory distress.     Breath sounds: Normal breath sounds. No wheezing or rales.  Abdominal:     General: Bowel sounds are normal. There is no distension.     Palpations: Abdomen is soft. There is no mass.     Tenderness: There is no abdominal tenderness.  Musculoskeletal:        General: No tenderness. Normal range of motion.     Cervical back: Normal  range of motion and neck supple.     Comments: Upper / Lower Extremities: - Normal muscle tone, strength bilateral upper extremities 5/5, lower extremities 5/5  Low Back Inspection: Normal appearance, thin body habitus, no spinal deformity, symmetrical. Palpation: No tenderness over spinous processes. Bilateral lumbar paraspinal muscles non-tender and without hypertonicity/spasm. ROM: Full active ROM forward flex / back extension, rotation L/R without discomfort Special Testing: Seated SLR negative for radicular pain bilaterally  Strength: Bilateral hip flex/ext 5/5, knee flex/ext 5/5, ankle dorsiflex/plantarflex 5/5 Neurovascular: intact distal sensation to light touch   Lymphadenopathy:     Cervical: No cervical adenopathy.  Skin:    General: Skin is warm and dry.     Findings: No erythema or rash.  Neurological:     Mental Status: She is alert and oriented to person, place, and time.     Comments: Distal sensation intact to light touch all extremities  Psychiatric:        Behavior: Behavior normal.     Comments: Well groomed, good eye contact, normal speech and thoughts       Results for orders placed or performed in visit on 04/30/19  Hepatitis C antibody  Result Value Ref Range   Hepatitis C Ab NON-REACTIVE NON-REACTI   SIGNAL TO CUT-OFF 0.02 <1.00  TSH  Result Value Ref Range   TSH 0.72 mIU/L  Lipid panel  Result Value Ref Range   Cholesterol 179 <200 mg/dL   HDL 77 > OR = 50 mg/dL   Triglycerides 96 <150 mg/dL   LDL Cholesterol (Calc) 83 mg/dL (calc)   Total CHOL/HDL Ratio 2.3 <5.0 (calc)   Non-HDL Cholesterol (Calc) 102 <130 mg/dL (calc)  COMPLETE METABOLIC PANEL WITH GFR  Result Value Ref Range   Glucose, Bld 90 65 - 99 mg/dL   BUN 26 (H) 7 - 25 mg/dL   Creat 1.01 0.50 - 1.05 mg/dL   GFR, Est Non African American 63 > OR = 60 mL/min/1.64m2   GFR, Est African American 73 > OR = 60 mL/min/1.48m2   BUN/Creatinine Ratio 26 (H) 6 - 22 (calc)   Sodium 137 135 -  146 mmol/L   Potassium 4.7 3.5 - 5.3 mmol/L   Chloride 104 98 - 110 mmol/L   CO2 24 20 - 32 mmol/L   Calcium 9.5 8.6 - 10.4 mg/dL   Total Protein 6.5 6.1 - 8.1 g/dL   Albumin 4.0 3.6 - 5.1 g/dL   Globulin 2.5 1.9 - 3.7 g/dL (calc)   AG Ratio 1.6 1.0 - 2.5 (calc)   Total Bilirubin 0.3 0.2 - 1.2 mg/dL   Alkaline phosphatase (APISO) 40 37 - 153 U/L   AST 21 10 - 35 U/L   ALT 14 6 - 29 U/L  CBC with Differential/Platelet  Result Value Ref Range   WBC 7.6 3.8 - 10.8 Thousand/uL   RBC 3.80 3.80 - 5.10 Million/uL   Hemoglobin 12.5 11.7 - 15.5 g/dL   HCT 37.1 35.0 - 45.0 %   MCV 97.6 80.0 - 100.0 fL   MCH 32.9 27.0 - 33.0 pg   MCHC 33.7 32.0 - 36.0 g/dL   RDW 11.4 11.0 - 15.0 %   Platelets 252 140 - 400 Thousand/uL   MPV 10.0 7.5 - 12.5 fL   Neutro Abs 5,525 1,500 - 7,800 cells/uL   Lymphs Abs 1,277 850 - 3,900 cells/uL   Absolute Monocytes 616 200 - 950 cells/uL   Eosinophils Absolute 114 15 - 500 cells/uL   Basophils Absolute 68 0 - 200 cells/uL   Neutrophils Relative % 72.7 %   Total Lymphocyte 16.8 %   Monocytes Relative 8.1 %   Eosinophils Relative 1.5 %   Basophils Relative 0.9 %  Hemoglobin A1c  Result Value Ref Range   Hgb A1c MFr Bld 5.1 <5.7 % of total Hgb   Mean Plasma Glucose 100 (calc)   eAG (mmol/L) 5.5 (calc)      Assessment & Plan:   Problem List Items Addressed This Visit    Tobacco abuse    Counseling on smoking cessation Not ready to quit Return when ready      Essential hypertension    Controlled HTN - self reduced lisinopril since last visit - Home BP readings reported normal  No known complications Off HCTZ now, may not be needed    Plan:  1. Agree with reduced dose Lisionpril back to 10mg  daily (half of 20mg ) - will order new 10mg  tab when requested 2. Encourage improved lifestyle - low sodium diet, regular exercise 3. Continue monitor BP outside office, bring readings to next visit, if persistently >140/90 or new symptoms notify office  sooner  Yearly      Relevant Medications   lisinopril (ZESTRIL) 20 MG tablet    Other Visit Diagnoses    Annual physical exam    -  Primary   Chronic left-sided low back pain with left-sided sciatica       Relevant Medications   predniSONE (DELTASONE) 10 MG tablet   gabapentin (NEURONTIN) 300 MG capsule   Screening for colon cancer       Relevant Orders   [place] - Cologuard      Updated Health Maintenance information Reviewed recent lab results with patient Encouraged improvement to lifestyle with diet and exercise Maintain healthy weight  Due for routine colon cancer screening. Never had colonoscopy (not interested), no family history colon cancer. - Discussion today about recommendations for either Colonoscopy or Cologuard screening, benefits and risks of screening, interested in Cologuard, understands that if positive then recommendation is for diagnostic colonoscopy to follow-up. - Ordered Cologuard today  --------  #Back pain, sciatica  Subacute on chronic L LBP with associated /L sciatica. Suspect likely due to muscle spasm/strain, without known injury or trauma.  In setting of known chronic LBP with DJD - No red flag symptoms. Negative SLR for radiculopathy - Inadequate conservative therapy   Not responding to conservative therapy  Plan: 1. Start prednisone taper 6 day 60 to 10mg  2. Ordered Gabapentin 300mg  capsules (previously on before) up to 300mg  TID or 900mg  nightly 3. May use Tylenol PRN for breakthrough 4. Encouraged use of heating pad 1-2x daily for now then PRN 5.  Follow-up 4-6 weeks if not improved for re-evaluation, consider X-ray imaging, trial of PT, and possibly referral to Orthopedic   Meds ordered this encounter  Medications  . predniSONE (DELTASONE) 10 MG tablet    Sig: Take 6 tabs with breakfast Day 1, 5 tabs Day 2, 4 tabs Day 3, 3 tabs Day 4, 2 tabs Day 5, 1 tab Day 6.    Dispense:  21 tablet    Refill:  0  . gabapentin (NEURONTIN) 300 MG  capsule    Sig: Take 1 capsule (300 mg total) by mouth 3 (three) times daily. Or 3 capsules at bedtime total 900mg . Start with 1 capsule daily, gradually increase    Dispense:  90 capsule    Refill:  2      Follow up plan: Return in about 1 year (around 05/14/2020) for Annual Physical.  Nobie Putnam, DO [facility] 05/15/2019, 2:14 PM

## 2019-06-05 LAB — COLOGUARD: Cologuard: NEGATIVE

## 2019-06-08 ENCOUNTER — Encounter: Payer: Self-pay | Admitting: Family Medicine

## 2019-08-30 ENCOUNTER — Other Ambulatory Visit: Payer: Self-pay | Admitting: Family Medicine

## 2019-08-30 DIAGNOSIS — I10 Essential (primary) hypertension: Secondary | ICD-10-CM

## 2019-08-30 DIAGNOSIS — G8929 Other chronic pain: Secondary | ICD-10-CM

## 2019-08-30 MED ORDER — GABAPENTIN 300 MG PO CAPS: 300.0000 mg | ORAL_CAPSULE | Freq: Three times a day (TID) | ORAL | 1 refills | Status: DC

## 2019-08-30 MED ORDER — LISINOPRIL 20 MG PO TABS: 20.0000 mg | ORAL_TABLET | Freq: Every day | ORAL | 1 refills | Status: DC

## 2019-08-30 MED ORDER — METOPROLOL TARTRATE 25 MG PO TABS: 25.0000 mg | ORAL_TABLET | Freq: Two times a day (BID) | ORAL | 1 refills | Status: DC

## 2019-08-30 NOTE — Telephone Encounter (Signed)
Pt is changing Drug store to [place] she need a 90 day supply on    Gabapentin Lisinopril Metoprolol tartrate

## 2020-04-24 DIAGNOSIS — Z20822 Contact with and (suspected) exposure to covid-19: Secondary | ICD-10-CM | POA: Diagnosis not present

## 2020-08-12 ENCOUNTER — Ambulatory Visit
Admission: EM | Admit: 2020-08-12 | Discharge: 2020-08-12 | Disposition: A | Discharge status: Home / Self Care | Payer: BC Managed Care – PPO | Attending: Emergency Medicine | Admitting: Emergency Medicine

## 2020-08-12 ENCOUNTER — Ambulatory Visit (INDEPENDENT_AMBULATORY_CARE_PROVIDER_SITE_OTHER)
Admit: 2020-08-12 | Discharge: 2020-08-12 | Disposition: A | Discharge status: Home / Self Care | Payer: BC Managed Care – PPO | Attending: Emergency Medicine | Admitting: Emergency Medicine

## 2020-08-12 ENCOUNTER — Other Ambulatory Visit: Payer: Self-pay

## 2020-08-12 DIAGNOSIS — K529 Noninfective gastroenteritis and colitis, unspecified: Secondary | ICD-10-CM | POA: Diagnosis not present

## 2020-08-12 DIAGNOSIS — R109 Unspecified abdominal pain: Secondary | ICD-10-CM | POA: Diagnosis not present

## 2020-08-12 LAB — URINALYSIS, COMPLETE (UACMP) WITH MICROSCOPIC
Glucose, UA: NEGATIVE mg/dL
Hgb urine dipstick: NEGATIVE
Ketones, ur: 15 mg/dL — AB
Leukocytes,Ua: NEGATIVE
Nitrite: NEGATIVE
Protein, ur: NEGATIVE mg/dL
Specific Gravity, Urine: 1.02 (ref 1.005–1.030)
pH: 5.5 (ref 5.0–8.0)

## 2020-08-12 LAB — COMPREHENSIVE METABOLIC PANEL
ALT: 19 U/L (ref 0–44)
AST: 25 U/L (ref 15–41)
Albumin: 4.5 g/dL (ref 3.5–5.0)
Alkaline Phosphatase: 45 U/L (ref 38–126)
Anion gap: 10 (ref 5–15)
BUN: 24 mg/dL — ABNORMAL HIGH (ref 6–20)
CO2: 23 mmol/L (ref 22–32)
Calcium: 9.7 mg/dL (ref 8.9–10.3)
Chloride: 99 mmol/L (ref 98–111)
Creatinine, Ser: 1.04 mg/dL — ABNORMAL HIGH (ref 0.44–1.00)
GFR, Estimated: >60 mL/min (ref >60)
Glucose, Bld: 111 mg/dL — ABNORMAL HIGH (ref 70–99)
Potassium: 4.8 mmol/L (ref 3.5–5.1)
Sodium: 132 mmol/L — ABNORMAL LOW (ref 135–145)
Total Bilirubin: 1 mg/dL (ref 0.3–1.2)
Total Protein: 8 g/dL (ref 6.5–8.1)

## 2020-08-12 LAB — CBC WITH DIFFERENTIAL/PLATELET
Abs Immature Granulocytes: 0.05 10*3/uL (ref 0.00–0.07)
Basophils Absolute: 0 10*3/uL (ref 0.0–0.1)
Basophils Relative: 1 %
Eosinophils Absolute: 0.1 10*3/uL (ref 0.0–0.5)
Eosinophils Relative: 1 %
HCT: 39.7 % (ref 36.0–46.0)
Hemoglobin: 13.4 g/dL (ref 12.0–15.0)
Immature Granulocytes: 1 %
Lymphocytes Relative: 10 %
Lymphs Abs: 0.8 10*3/uL (ref 0.7–4.0)
MCH: 33.1 pg (ref 26.0–34.0)
MCHC: 33.8 g/dL (ref 30.0–36.0)
MCV: 98 fL (ref 80.0–100.0)
Monocytes Absolute: 1 10*3/uL (ref 0.1–1.0)
Monocytes Relative: 11 %
Neutro Abs: 6.8 10*3/uL (ref 1.7–7.7)
Neutrophils Relative %: 76 %
Platelets: 294 10*3/uL (ref 150–400)
RBC: 4.05 MIL/uL (ref 3.87–5.11)
RDW: 13.3 % (ref 11.5–15.5)
WBC: 8.8 10*3/uL (ref 4.0–10.5)
nRBC: 0 % (ref 0.0–0.2)

## 2020-08-12 MED ORDER — ONDANSETRON 8 MG PO TBDP: 8.0000 mg | ORAL_TABLET | Freq: Three times a day (TID) | ORAL | 0 refills | Status: DC | PRN

## 2020-08-12 MED ORDER — IOHEXOL 300 MG/ML  SOLN
100.0000 mL | Freq: Once | INTRAMUSCULAR | Status: AC | PRN
  Administered 2020-08-12: 100 mL via INTRAVENOUS

## 2020-08-12 NOTE — Discharge Instructions (Addendum)
Use the Zofran every 8 hours as needed for nausea and vomiting.  Increase your oral intake of clear liquids to help maintain hydration, preserve your kidney function, and to give your bowel a rest.  Clear liquids include broth, ginger ale, water, Pedialyte, popsicles, and Jell-O.  Follow a clear liquid diet for the next 12 to 24 hours and then slowly increase her diet with bland foods first such as bananas, rice, applesauce, and toast.  If you develop vomiting not controlled with Zofran and you are unable to keep down oral fluids, your belly becomes distended, you stop passing stool or bowel gas, or your abdominal pain increases you need to go to the ER for evaluation.

## 2020-08-12 NOTE — ED Notes (Signed)
PIV placed for CT scan

## 2020-08-12 NOTE — ED Notes (Signed)
Patient has been approved for CPT [phone]. Obtained via [employer] [place] . Valid for 08/12/2020 through 10/11/2020. Authorization # [id number].

## 2020-08-12 NOTE — ED Provider Notes (Signed)
[facility]    CSN: [id number] Arrival date & time: 08/12/20  6503      History   Chief Complaint Chief Complaint  Patient presents with  . Emesis  . Nausea    HPI Tina Chang is a 55 y.o. female.   HPI   55 year old female here for evaluation of abdominal pain.  Patient reports that she is having abdominal pain behind her bellybutton has been present for last 2 days.  She had associated symptoms of a fever with a T-max of 100.2, nausea, vomiting, and diarrhea.  Patient also reports that she has had a decreased appetite.  She is unsure of what makes the pain worse she just describes it as an intense sharp cramping.  Patient does report that bawling up in the fetal position does improve her pain.  Patient denies any blood in her emesis or stool and she denies any urinary complaints.  Patient has a history of cervical cancer with removal of her cervix.  Patient reports she does still have her appendix.  Past Medical History:  Diagnosis Date  . Cervical cancer ([place])   . Hypertension     Patient Active Problem List   Diagnosis Date Noted  . Essential hypertension 02/05/2019  . Tobacco abuse 02/05/2019    Past Surgical History:  Procedure Laterality Date  . CERVICAL CONE BIOPSY  10/2010   cervical biopsy, cystoscopy, vaginal packing  . CERVIX REMOVAL    . DILATION AND CURETTAGE OF UTERUS    . 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 Removal of Bakri Balloon  10/2010    OB History   No obstetric history on file.      Home Medications    Prior to Admission medications   Medication Sig Start Date End Date Taking? Authorizing Provider  aspirin EC 81 MG EC tablet Take 1 tablet (81 mg total) by mouth daily. 06/03/15  Yes Mody, Ulice Bold, MD  lisinopril (ZESTRIL) 20 MG tablet Take 1 tablet (20 mg total) by mouth daily. 08/30/19  Yes Karamalegos, Devonne Doughty, DO  metoprolol tartrate (LOPRESSOR) 25 MG tablet Take 1 tablet (25 mg total)  by mouth 2 (two) times daily. 08/30/19  Yes Karamalegos, Devonne Doughty, DO  ondansetron (ZOFRAN ODT) 8 MG disintegrating tablet Take 1 tablet (8 mg total) by mouth every 8 (eight) hours as needed for nausea or vomiting. 08/12/20  Yes Margarette Canada, NP  gabapentin (NEURONTIN) 300 MG capsule Take 1 capsule (300 mg total) by mouth 3 (three) times daily. 08/30/19 08/12/20  Olin Hauser, DO    Family History Family History  Problem Relation Age of Onset  . Hypertension Mother   . Arrhythmia Mother   . CAD Father   . Diabetes Father   . Stroke Father   . Heart attack Brother 55  . Breast cancer Neg Hx     Social History Social History   Tobacco Use  . Smoking status: Current Every Day Smoker    Packs/day: 0.50    Years: 20.00    Pack years: 10.00    Types: Cigarettes  . Smokeless tobacco: Never Used  Vaping Use  . Vaping Use: Never used  Substance Use Topics  . Alcohol use: Yes    Alcohol/week: 3.0 standard drinks    Types: 3 Standard drinks or equivalent per week  . Drug use: No     Allergies   Codeine, Demerol [meperidine], and Penicillins   Review of  Systems Review of Systems  Constitutional: Positive for activity change and fever.  Gastrointestinal: Positive for abdominal pain, diarrhea, nausea and vomiting. Negative for blood in stool.  Genitourinary: Negative for dysuria, frequency and urgency.  Musculoskeletal: Positive for back pain.  Skin: Negative for rash.  Hematological: Negative.   Psychiatric/Behavioral: Negative.      Physical Exam Triage Vital Signs ED Triage Vitals  Enc Vitals Group     BP      Pulse      Resp      Temp      Temp src      SpO2      Weight      Height      Head Circumference      Peak Flow      Pain Score      Pain Loc      Pain Edu?      Excl. in [place]?    No data found.  Updated Vital Signs BP 109/68 (BP Location: Left Arm)   Pulse 89   Temp 97.8 F (36.6 C) (Oral)   Resp 18   Ht 5\' 6"  (1.676 m)   Wt 140 lb  (63.5 kg)   SpO2 100%   BMI 22.60 kg/m   Visual Acuity Right Eye Distance:   Left Eye Distance:   Bilateral Distance:    Right Eye Near:   Left Eye Near:    Bilateral Near:     Physical Exam Vitals and nursing note reviewed.  Constitutional:      General: She is in acute distress.     Appearance: Normal appearance. She is normal weight.  HENT:     Head: Normocephalic and atraumatic.  Cardiovascular:     Rate and Rhythm: Normal rate and regular rhythm.     Pulses: Normal pulses.     Heart sounds: Normal heart sounds. No murmur heard. No gallop.   Pulmonary:     Effort: Pulmonary effort is normal.     Breath sounds: Normal breath sounds. No wheezing, rhonchi or rales.  Abdominal:     General: Abdomen is flat. Bowel sounds are normal.     Palpations: Abdomen is soft. There is no mass.     Tenderness: There is abdominal tenderness. There is no right CVA tenderness, left CVA tenderness, guarding or rebound.     Hernia: No hernia is present.  Skin:    General: Skin is warm and dry.     Capillary Refill: Capillary refill takes less than 2 seconds.     Findings: No erythema or rash.  Neurological:     General: No focal deficit present.     Mental Status: She is alert and oriented to person, place, and time.  Psychiatric:        Mood and Affect: Mood normal.        Behavior: Behavior normal.        Thought Content: Thought content normal.        Judgment: Judgment normal.      UC Treatments / Results  Labs (all labs ordered are listed, but only abnormal results are displayed) Labs Reviewed  COMPREHENSIVE METABOLIC PANEL - Abnormal; Notable for the following components:      Result Value   Sodium 132 (*)    Glucose, Bld 111 (*)    BUN 24 (*)    Creatinine, Ser 1.04 (*)    All other components within normal limits  URINALYSIS, COMPLETE (UACMP) WITH MICROSCOPIC -  Abnormal; Notable for the following components:   Bilirubin Urine SMALL (*)    Ketones, ur 15 (*)     Bacteria, UA FEW (*)    All other components within normal limits  CBC WITH DIFFERENTIAL/PLATELET    EKG   Radiology CT ABDOMEN PELVIS W CONTRAST  Result Date: 08/12/2020 CLINICAL DATA:  55 year old with periumbilical abdominal pain. Evaluate for appendicitis. EXAM: CT ABDOMEN AND PELVIS WITH CONTRAST TECHNIQUE: Multidetector CT imaging of the abdomen and pelvis was performed using the standard protocol following bolus administration of intravenous contrast. CONTRAST:  125mL OMNIPAQUE IOHEXOL 300 MG/ML  SOLN COMPARISON:  CT 06/11/2011 and lumbar spine 02/18/2015 FINDINGS: Lower chest: Lung bases are clear. Hepatobiliary: Again noted is a low-density area in the liver near the gallbladder. There was a similar finding on the exam from 2013 and suspect this is a benign finding such as focal fat. Again noted is focal fat near the falciform ligament. No suspicious hepatic lesion. Main portal venous system is patent. Normal appearance of the gallbladder. Common bile duct is dilated measuring close to 9 mm. Difficult to evaluate the common bile duct on the previous non contrast examination. Mild dilatation of the central intrahepatic bile ducts. Pancreas: Unremarkable. No pancreatic ductal dilatation or surrounding inflammatory changes. Spleen: Normal in size without focal abnormality. Adrenals/Urinary Tract: Normal appearance of the adrenal glands. Right kidney is severely atrophic and this represents a change from 2013. Fullness of the left renal pelvis and left renal calices. Diffuse wall thickening in the urinary bladder. Stomach/Bowel: Normal retrocecal appendix that extends into the upper abdomen near the liver. No evidence for acute inflammation. Multiple dilated loops of small bowel. Small bowel measures up to 3.6 cm in the left abdomen on sequence 2 image 42. Mild bowel wall thickening and mesenteric edema in the central pelvis on sequence 2, image 56. Small bowel wall thickening is best seen on the  sagittal images, sequence 5 image 58. No significant abnormality involving the colon. Vascular/Lymphatic: Extensive atherosclerotic calcifications in the abdominal aorta without aneurysm or dissection. Celiac trunk and SMA are patent. IMA appears to be patent. Atherosclerotic disease involving the renal arteries. Concern for high-grade stenosis in the right renal artery based on the right renal atrophy. Circumaortic left renal vein. Reproductive: Status post hysterectomy. No adnexal masses. Other: Small amount ascites in the pelvis. Mesenteric edema particularly in the lower abdomen. Negative for free air. Musculoskeletal: Grade 2 anterolisthesis at L4-L5 with loss of disc space at L4-L5. Approximately 10 cm anterolisthesis at L4-L5. Sclerosis and endplate changes at C7-E9. Mild anterolisthesis at L3-L4. Anterolisthesis in lumbar spine appears to be related to severe facet arthropathy. IMPRESSION: 1. Dilated loops of small bowel with evidence of mesenteric edema and small bowel wall thickening in the pelvic region. Findings are concerning for enteritis and there could be a small bowel obstruction or ileus. Small amount of ascites. 2. Normal appearance of the appendix. 3. Diffuse wall thickening in the urinary bladder is concerning for cystitis. 4. Mild dilatation of the biliary system. If there is clinical concern for biliary obstruction consider further characterization with MRCP. 5. Chronic low-density areas in the liver most likely represent focal fat. 6. Right kidney is atrophic and this is new since 2013. Patient has extensive atherosclerotic disease and suspect hemodynamically significant right renal artery stenosis. 7. Fullness of the left renal collecting system without significant hydronephrosis. 8. 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 These results were called by telephone at  the time of interpretation on 08/12/2020 at 12:41 pm to provider Margarette Canada ,  who verbally acknowledged these results. Electronically Signed   By: Markus Daft M.D.   On: 08/12/2020 12:42    Procedures Procedures (including critical care time)  Medications Ordered in UC Medications - No data to display  Initial Impression / Assessment and Plan / UC Course  I have reviewed the triage vital signs and the nursing notes.  Pertinent labs & imaging results that were available during my care of the patient were reviewed by me and considered in my medical decision making (see chart for details).   Is for evaluation of periumbilical pain that began 2 days ago and has been associated with a fever, nausea, vomiting, and diarrhea.  Patient appears to be in discomfort and she is holding her abdomen while hunched over.  Sitting upright and movement is difficult for her.  Physical exam reveals a flat, soft abdomen with normal bowel sounds in all quadrants.  Patient has diffuse tenderness without any localization to palpation.  No CVA tenderness on exam.  No guarding or rebound.  Concern for appendicitis given the periumbilical nature and associated with fever, anorexia, and emesis.  Secondary concern would be gastroenteritis.  Will obtain CBC, CMP, UA, and CT the abdomen pelvis with contrast.  CBC is unremarkable.  UA shows small bilirubin, 15 ketones, and few bacteria.  Negative for leukocyte esterase or nitrites.  No protein.  CMP shows mild hyponatremia at 132.  BUN and creatinine are mildly elevated at 24 and 1.04.  Patient also has an anion gap of 10.  Previous CMP showed a BUN of 26 and creatinine of 1.01 on 05/11/2019.  CT abdomen pelvis is pending.  Spoke to Dr. Markus Daft with [facility] radiology regarding CT report.  He reports that the appendix normal but the CT demonstrates significant small bowel dilatation with mesenteric edema suggestive of enteritis.  There is also some dilated loops of small bowel suggesting possible developing obstruction or ileus.  The right kidney  is completely atrophied.  Common bile duct is 9 mm.  Dr. Anselm Pancoast also reports bladder wall thickening.    Patient does have a history of cervical cancer with radiation to her pelvis which may account for the bladder wall thickening as her urinalysis does not show any signs of infection.  LFTs on CMP are completely normal and Dr. Anselm Pancoast does not see any evidence of an obstructing stone making cholecystitis less likely.  Spoke with patient about the results and inquired about patient's bowel pattern.  Patient reports that she is still passing stool and flatus.  Will treat patient for enteritis with clear liquid diet and Zofran to control nausea.  Patient advised that if she develops any increase in her abdominal pain, vomiting that is not controlled by the Zofran and prevents her from keeping oral fluids down, if she stops passing gas or stool, or she develops abdominal distention she needs to go to the emergency department for evaluation.  Patient also advised to follow-up with her primary care provider for referral to nephrology to monitor her existing kidney.   Final Clinical Impressions(s) / UC Diagnoses   Final diagnoses:  Enteritis   Discharge Instructions   None    ED Prescriptions    Medication Sig Dispense Auth. Provider   ondansetron (ZOFRAN ODT) 8 MG disintegrating tablet Take 1 tablet (8 mg total) by mouth every 8 (eight) hours as needed for nausea or vomiting. 20 tablet Margarette Canada,  NP     PDMP not reviewed this encounter.   Margarette Canada, NP 08/12/20 1254

## 2020-08-12 NOTE — ED Triage Notes (Signed)
Pt c/o abd pain, n/v/d for 2 days, chills and fever (100.2) last night. Pt has taken Dramamine and that did not help.

## 2020-08-13 ENCOUNTER — Encounter: Payer: Self-pay | Admitting: Family Medicine

## 2020-08-13 ENCOUNTER — Ambulatory Visit (INDEPENDENT_AMBULATORY_CARE_PROVIDER_SITE_OTHER): Payer: BC Managed Care – PPO | Admitting: Family Medicine

## 2020-08-13 VITALS — Ht 66.0 in | Wt 140.0 lb

## 2020-08-13 DIAGNOSIS — N261 Atrophy of kidney (terminal): Secondary | ICD-10-CM | POA: Diagnosis not present

## 2020-08-13 DIAGNOSIS — N3001 Acute cystitis with hematuria: Secondary | ICD-10-CM | POA: Diagnosis not present

## 2020-08-13 DIAGNOSIS — R109 Unspecified abdominal pain: Secondary | ICD-10-CM | POA: Diagnosis not present

## 2020-08-13 DIAGNOSIS — K529 Noninfective gastroenteritis and colitis, unspecified: Secondary | ICD-10-CM | POA: Diagnosis not present

## 2020-08-13 MED ORDER — DICYCLOMINE HCL 10 MG PO CAPS
10.0000 mg | ORAL_CAPSULE | Freq: Three times a day (TID) | ORAL | 1 refills | Status: DC
Start: 2020-08-13 — End: 2020-09-01

## 2020-08-13 MED ORDER — CIPROFLOXACIN HCL 500 MG PO TABS: 500.0000 mg | ORAL_TABLET | Freq: Two times a day (BID) | ORAL | 0 refills | Status: DC

## 2020-08-13 NOTE — Progress Notes (Signed)
Virtual Visit via Telephone The purpose of this virtual visit is to provide medical care while limiting exposure to the novel coronavirus (COVID19) for both patient and office staff.  Consent was obtained for phone visit:  Yes.   Answered questions that patient had about telehealth interaction:  Yes.   I discussed the limitations, risks, security and privacy concerns of performing an evaluation and management service by telephone. I also discussed with the patient that there may be a patient responsible charge related to this service. The patient expressed understanding and agreed to proceed.  Patient Location: Home Provider Location: [facility] (Office)  Participants in virtual visit: - Patient: Tina Chang - CMA: Orinda Kenner, CMA - Provider: Dr Parks Ranger  ---------------------------------------------------------------------- Chief Complaint  Patient presents with  . Diarrhea  . Emesis  . Abdominal Pain    S: Reviewed CMA documentation. I have called patient and gathered additional HPI as follows:  Urgent Care FOLLOW-UP VISIT  Hospital/Location: [facility] Date of UC Visit: 08/12/20  Reason for Presenting to UC: Abdominal pain, fever, nausea vomiting  Primary (+Secondary) Diagnosis: Enteritis  FOLLOW-UP - ED provider note and record have been reviewed - Patient presents today about 1 day after recent UC visit. Brief summary of recent course, patient had symptoms of abdominal pain periumbilical and nausea vomiting, low grade fever 100F for 3 days, presented to ED on 08/12/20 also had poor PO intake due to symptoms. Worse with eating. Nothing else triggered her pain. Pain improved if curled up or less activity. She had lab testing and urine and CT abdomen, see below, showed enteritis. Given bowel rest liquid diet, zofran discharged w/ follow-up now. 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 Symptoms of abdominal pain still present but some improve, worse at night, still limited PO intake.  Still working. Does physical work some lifting.  Denies any known or suspected exposure to person with or possibly with COVID19.  Denies any fevers, chills, sweats, body ache, cough, shortness of breath, sinus pain or pressure, headache, diarrhea  Past Medical History:  Diagnosis Date  . Cervical cancer ([place])   . Hypertension    Social History   Tobacco Use  . Smoking status: Current Every Day Smoker    Packs/day: 0.50    Years: 20.00    Pack years: 10.00    Types: Cigarettes  . Smokeless tobacco: Never Used  Vaping Use  . Vaping Use: Never used  Substance Use Topics  . Alcohol use: Yes    Alcohol/week: 3.0 standard drinks    Types: 3 Standard drinks or equivalent per week  . Drug use: No    Current Outpatient Medications:  .  aspirin EC 81 MG EC tablet, Take 1 tablet (81 mg total) by mouth daily., Disp: 120 tablet, Rfl: 0 .  ciprofloxacin (CIPRO) 500 MG tablet, Take 1 tablet (500 mg total) by mouth 2 (two) times daily., Disp: 14 tablet, Rfl: 0 .  dicyclomine (BENTYL) 10 MG capsule, Take 1 capsule (10 mg total) by mouth 4 (four) times daily -  before meals and at bedtime., Disp: 40 capsule, Rfl: 1 .  lisinopril (ZESTRIL) 20 MG tablet, Take 1 tablet (20 mg total) by mouth daily., Disp: 90 tablet, Rfl: 1 .  metoprolol tartrate (LOPRESSOR) 25 MG tablet, Take 1 tablet (25 mg total) by mouth 2 (two) times daily., Disp:  180 tablet, Rfl: 1 .  ondansetron (ZOFRAN ODT) 8 MG disintegrating tablet, Take 1 tablet (8 mg total) by mouth every 8 (eight) hours as needed for nausea or vomiting., Disp: 20 tablet, Rfl: 0  Depression screen [facility] 2/9 02/05/2019  Decreased Interest 0  Down, Depressed, Hopeless 0  PHQ - 2 Score 0    No flowsheet data  found.  -------------------------------------------------------------------------- O: No physical exam performed due to remote telephone encounter.  Lab results reviewed.  I have personally reviewed the radiology report from CT abdomen pelvis on 08/12/20.   CT ABDOMEN PELVIS W CONTRASTPerformed 08/12/2020 Final result  Study Result CLINICAL DATA: 55 year old with periumbilical abdominal pain. Evaluate for appendicitis.  EXAM: CT ABDOMEN AND PELVIS WITH CONTRAST  TECHNIQUE: Multidetector CT imaging of the abdomen and pelvis was performed using the standard protocol following bolus administration of intravenous contrast.  CONTRAST: 136mL OMNIPAQUE IOHEXOL 300 MG/ML SOLN  COMPARISON: CT 06/11/2011 and lumbar spine 02/18/2015  FINDINGS: Lower chest: Lung bases are clear.  Hepatobiliary: Again noted is a low-density area in the liver near the gallbladder. There was a similar finding on the exam from 2013 and suspect this is a benign finding such as focal fat. Again noted is focal fat near the falciform ligament. No suspicious hepatic lesion. Main portal venous system is patent. Normal appearance of the gallbladder. Common bile duct is dilated measuring close to 9 mm. Difficult to evaluate the common bile duct on the previous non contrast examination. Mild dilatation of the central intrahepatic bile ducts.  Pancreas: Unremarkable. No pancreatic ductal dilatation or surrounding inflammatory changes.  Spleen: Normal in size without focal abnormality.  Adrenals/Urinary Tract: Normal appearance of the adrenal glands. Right kidney is severely atrophic and this represents a change from 2013. Fullness of the left renal pelvis and left renal calices. Diffuse wall thickening in the urinary bladder.  Stomach/Bowel: Normal retrocecal appendix that extends into the upper abdomen near the liver. No evidence for acute inflammation. Multiple dilated loops of small bowel. Small bowel  measures up to 3.6 cm in the left abdomen on sequence 2 image 42. Mild bowel wall thickening and mesenteric edema in the central pelvis on sequence 2, image 56. Small bowel wall thickening is best seen on the sagittal images, sequence 5 image 58. No significant abnormality involving the colon.  Vascular/Lymphatic: Extensive atherosclerotic calcifications in the abdominal aorta without aneurysm or dissection. Celiac trunk and SMA are patent. IMA appears to be patent. Atherosclerotic disease involving the renal arteries. Concern for high-grade stenosis in the right renal artery based on the right renal atrophy. Circumaortic left renal vein.  Reproductive: Status post hysterectomy. No adnexal masses.  Other: Small amount ascites in the pelvis. Mesenteric edema particularly in the lower abdomen. Negative for free air.  Musculoskeletal: Grade 2 anterolisthesis at L4-L5 with loss of disc space at L4-L5. Approximately 10 cm anterolisthesis at L4-L5. Sclerosis and endplate changes at E5-I7. Mild anterolisthesis at L3-L4. Anterolisthesis in lumbar spine appears to be related to severe facet arthropathy.  IMPRESSION: 1. Dilated loops of small bowel with evidence of mesenteric edema and small bowel wall thickening in the pelvic region. Findings are concerning for enteritis and there could be a small bowel obstruction or ileus. Small amount of ascites. 2. Normal appearance of the appendix. 3. Diffuse wall thickening in the urinary bladder is concerning for cystitis. 4. Mild dilatation of the biliary system. If there is clinical concern for biliary obstruction consider further characterization with MRCP. 5. Chronic low-density areas in  the liver most likely represent focal fat. 6. Right kidney is atrophic and this is new since 2013. Patient has extensive atherosclerotic disease and suspect hemodynamically significant right renal artery stenosis. 7. Fullness of the left renal collecting  system without significant hydronephrosis. 8. 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  These results were called by telephone at the time of interpretation on 08/12/2020 at 12:41 pm to provider Margarette Canada , who verbally acknowledged these results.   Electronically Signed By: Markus Daft M.D. On: 08/12/2020 12:42      Recent Results (from the past 2160 hour(s))  CBC with Differential     Status: None   Collection Time: 08/12/20 10:19 AM  Result Value Ref Range   WBC 8.8 4.0 - 10.5 K/uL   RBC 4.05 3.87 - 5.11 MIL/uL   Hemoglobin 13.4 12.0 - 15.0 g/dL   HCT 39.7 36.0 - 46.0 %   MCV 98.0 80.0 - 100.0 fL   MCH 33.1 26.0 - 34.0 pg   MCHC 33.8 30.0 - 36.0 g/dL   RDW 13.3 11.5 - 15.5 %   Platelets 294 150 - 400 K/uL   nRBC 0.0 0.0 - 0.2 %   Neutrophils Relative % 76 %   Neutro Abs 6.8 1.7 - 7.7 K/uL   Lymphocytes Relative 10 %   Lymphs Abs 0.8 0.7 - 4.0 K/uL   Monocytes Relative 11 %   Monocytes Absolute 1.0 0.1 - 1.0 K/uL   Eosinophils Relative 1 %   Eosinophils Absolute 0.1 0.0 - 0.5 K/uL   Basophils Relative 1 %   Basophils Absolute 0.0 0.0 - 0.1 K/uL   Immature Granulocytes 1 %   Abs Immature Granulocytes 0.05 0.00 - 0.07 K/uL    Comment: Performed at [facility], [address], [place], [place] [address]  Comprehensive metabolic panel     Status: Abnormal   Collection Time: 08/12/20 10:19 AM  Result Value Ref Range   Sodium 132 (L) 135 - 145 mmol/L   Potassium 4.8 3.5 - 5.1 mmol/L   Chloride 99 98 - 111 mmol/L   CO2 23 22 - 32 mmol/L   Glucose, Bld 111 (H) 70 - 99 mg/dL    Comment: Glucose reference range applies only to samples taken after fasting for at least 8 hours.   BUN 24 (H) 6 - 20 mg/dL   Creatinine, Ser 1.04 (H) 0.44 - 1.00 mg/dL   Calcium 9.7 8.9 - 10.3 mg/dL   Total Protein 8.0 6.5 - 8.1 g/dL   Albumin 4.5 3.5 - 5.0 g/dL   AST 25 15 - 41 U/L   ALT 19 0 - 44 U/L   Alkaline Phosphatase 45  38 - 126 U/L   Total Bilirubin 1.0 0.3 - 1.2 mg/dL   GFR, Estimated >60 >60 mL/min    Comment: (NOTE) Calculated using the CKD-EPI Creatinine Equation (2021)    Anion gap 10 5 - 15    Comment: Performed at [facility], [address] [place], [place] [address]  Urinalysis, Complete w Microscopic Urine, Clean Catch     Status: Abnormal   Collection Time: 08/12/20 10:19 AM  Result Value Ref Range   Color, Urine YELLOW YELLOW   APPearance CLEAR CLEAR   Specific Gravity, Urine 1.020 1.005 - 1.030   pH 5.5 5.0 - 8.0   Glucose, UA NEGATIVE NEGATIVE mg/dL   Hgb urine dipstick NEGATIVE NEGATIVE   Bilirubin Urine SMALL (A) NEGATIVE  Ketones, ur 15 (A) NEGATIVE mg/dL   Protein, ur NEGATIVE NEGATIVE mg/dL   Nitrite NEGATIVE NEGATIVE   Leukocytes,Ua NEGATIVE NEGATIVE   Squamous Epithelial / LPF 0-5 0 - 5   WBC, UA 0-5 0 - 5 WBC/hpf   RBC / HPF 6-10 0 - 5 RBC/hpf   Bacteria, UA FEW (A) NONE SEEN   Mucus PRESENT     Comment: Performed at [facility], [address], [place], [place] [address]    -------------------------------------------------------------------------- A&P:  Problem List Items Addressed This Visit    Atrophic kidney, acquired   Relevant Orders   Ambulatory referral to Nephrology    Other Visit Diagnoses    Enteritis    -  Primary   Relevant Medications   ciprofloxacin (CIPRO) 500 MG tablet   dicyclomine (BENTYL) 10 MG capsule   Acute cystitis with hematuria       Relevant Medications   ciprofloxacin (CIPRO) 500 MG tablet   Abdominal cramping       Relevant Medications   dicyclomine (BENTYL) 10 MG capsule     Clinically mild improved without significant worsening CT Abdomen reviewed see above, multiple problems identified Likely cause of current symptoms Enteritis and Cystitis dx yesterday at UC She is on clear liquid and limited diet now gradually advancing On Zofran ODT PRN Rx today with Cipro 500 BID x 7 days to cover empiric  for Enteritis and also for Cystitis Rx today with Dicyclomine PRN abdomen cramping pain May warrant future GI consult if recurrent or unresolved, or if concern with biliary problem.  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   print work notes - she can pick up AVS and work notes tomorrow AM.  Orders Placed This Encounter  Procedures  . Ambulatory referral to Nephrology    Referral Priority:   Routine    Referral Type:   Consultation    Referral Reason:   Specialty Services Required    Requested Specialty:   Nephrology    Number of Visits Requested:   1     Meds ordered this encounter  Medications  . ciprofloxacin (CIPRO) 500 MG tablet    Sig: Take 1 tablet (500 mg total) by mouth 2 (two) times daily.    Dispense:  14 tablet    Refill:  0  . dicyclomine (BENTYL) 10 MG capsule    Sig: Take 1 capsule (10 mg total) by mouth 4 (four) times daily -  before meals and at bedtime.    Dispense:  40 capsule    Refill:  1    Follow-up: - Return as needed if not improved  Patient verbalizes understanding with the above medical recommendations including the limitation of remote medical advice.  Specific follow-up and call-back criteria were given for patient to follow-up or seek medical care more urgently if needed.   - Time spent in direct consultation with patient on phone: 20 minutes   Nobie Putnam, [facility] 08/13/2020, 4:28 PM

## 2020-08-13 NOTE — Patient Instructions (Addendum)
Referral to kidney specialist  [facility]) [address] [facility] [address] [place], [place] [address] Phone: [phone]   Stay tuned for apt. They will call you.  Also you may need to see a Vascular Specialist it looks like the circulation has impacted your Right Kidney, may need a vascular doctor to help treat this problem. You can review this with your Cardiologist as well.  ------------------ For inflammation of bowel - enteritis - it may resolve soon with treatment  Also imaging showed may have mild problem with gallbladder but unable to confirm on this test. May need further GI testing in future if unresolved.  You also have urinary tract infection.  Take the Dicyclomine as needed for cramping abdominal pain up to 4 times a day, temporary relief.  Finish the Cipro antibiotic course 7 days 1 twice a day. Be careful of high impact activity that can cause muscle tendon injury on this med.   Please schedule a Follow-up Appointment to: No follow-ups on file.  If you have any other questions or concerns, please feel free to call the office or send a message through [facility]. You may also schedule an earlier appointment if necessary.  Additionally, you may be receiving a survey about your experience at our office within a few days to 1 week by e-mail or mail. We value your feedback.  Nobie Putnam, DO [place]

## 2020-08-25 ENCOUNTER — Other Ambulatory Visit: Payer: Self-pay | Admitting: Family Medicine

## 2020-08-25 DIAGNOSIS — I10 Essential (primary) hypertension: Secondary | ICD-10-CM

## 2020-08-25 NOTE — Telephone Encounter (Signed)
Requested medication (s) are due for refill today: yes  Requested medication (s) are on the active medication list: yes  Last refill:  08/29/20 #180 1 refill  Future visit scheduled: no  Notes to clinic:  no valid encounter within 6 months. Called patient to schedule appt. No answer on mobile #. Left message to call clinic to schedule appt. Do you want courtesy refill?     Requested Prescriptions  Pending Prescriptions Disp Refills   metoprolol tartrate (LOPRESSOR) 25 MG tablet [Pharmacy Med Name: METOPROLOL TARTRATE 25 MG TAB] 180 tablet 1    Sig: TAKE 1 TABLET BY MOUTH TWICE DAILY      Cardiovascular:  Beta Blockers Failed - 08/25/2020  1:16 PM      Failed - Valid encounter within last 6 months    Recent Outpatient Visits           1 week ago [place], DO   1 year ago Annual physical exam   [facility] Olin Hauser, DO   1 year ago Essential hypertension   [place], DO                Passed - Last BP in normal range    BP Readings from Last 1 Encounters:  08/12/20 109/68          Passed - Last Heart Rate in normal range    Pulse Readings from Last 1 Encounters:  08/12/20 89

## 2020-08-27 ENCOUNTER — Encounter: Payer: Self-pay | Admitting: Family Medicine

## 2020-08-27 ENCOUNTER — Ambulatory Visit (INDEPENDENT_AMBULATORY_CARE_PROVIDER_SITE_OTHER): Payer: BC Managed Care – PPO | Admitting: Family Medicine

## 2020-08-27 ENCOUNTER — Other Ambulatory Visit: Payer: Self-pay

## 2020-08-27 VITALS — BP 142/70 | HR 83 | Ht 66.0 in | Wt 135.4 lb

## 2020-08-27 DIAGNOSIS — K838 Other specified diseases of biliary tract: Secondary | ICD-10-CM

## 2020-08-27 DIAGNOSIS — K279 Peptic ulcer, site unspecified, unspecified as acute or chronic, without hemorrhage or perforation: Secondary | ICD-10-CM | POA: Diagnosis not present

## 2020-08-27 DIAGNOSIS — R1084 Generalized abdominal pain: Secondary | ICD-10-CM

## 2020-08-27 DIAGNOSIS — R1013 Epigastric pain: Secondary | ICD-10-CM

## 2020-08-27 MED ORDER — OMEPRAZOLE 40 MG PO CPDR: 40.0000 mg | DELAYED_RELEASE_CAPSULE | Freq: Every day | ORAL | 2 refills | Status: DC

## 2020-08-27 MED ORDER — SUCRALFATE 1 G PO TABS: 1.0000 g | ORAL_TABLET | Freq: Three times a day (TID) | ORAL | 2 refills | Status: DC

## 2020-08-27 NOTE — Progress Notes (Signed)
Subjective:    Patient ID: Tina Chang, female    DOB: May 12, 1966, 55 y.o.   MRN: [id number]  Tina Chang is a 55 y.o. female presenting on 08/27/2020 for Abdominal Pain   HPI   Acute Postprandial Abdominal Pain / Epigastric Suspected PUD History of Enteritis Abnormal biliary distention on CT imaging  - Last visit with me virtually 08/13/20, for initial visit for same problem virtual visit following Urgent Care, had CT abdomen below showed variety of issues including enteritis and biliary dysfunction, treated with Cipro rx 500 BID x 7 days and Dicyclomine , see prior notes for background information. - Interval update with significant improvement with some symptoms but overall unresolved problem  Now no longer cramping and nausea vomiting - now resolved after 3 days of Cipro. Taking Dicyclomien PRn with some good results  She describes symptoms now with a meal after eating or during meal will have feeling of "rocks on stomach" Additionally new issue with symptoms worse in AM early with a "pulling" vs shooting straight back to the pain. Episodic symptoms. Mostly mid epigastric region with the radiating pain can be episodic  Rohm and Haas Powder twice a week PRN.  Admits some heartburn reflux symptoms as well. Not on PPI before.   Follow-up on R Kidney Atrophic, seen incidentally on recent CT Abdomen 3/22 Upcoming Nephrology apt 09/02/20    Depression screen PHQ 2/9 02/05/2019  Decreased Interest 0  Down, Depressed, Hopeless 0  PHQ - 2 Score 0    Social History   Tobacco Use  . Smoking status: Current Every Day Smoker    Packs/day: 0.50    Years: 20.00    Pack years: 10.00    Types: Cigarettes  . Smokeless tobacco: Never Used  Vaping Use  . Vaping Use: Never used  Substance Use Topics  . Alcohol use: Yes    Alcohol/week: 3.0 standard drinks    Types: 3 Standard drinks or equivalent per week  . Drug use: No    Review of Systems Per HPI unless specifically  indicated above     Objective:    BP (!) 142/70   Pulse 83   Ht 5\' 6"  (1.676 m)   Wt 135 lb 6.4 oz (61.4 kg)   SpO2 100%   BMI 21.85 kg/m   Wt Readings from Last 3 Encounters:  08/27/20 135 lb 6.4 oz (61.4 kg)  08/13/20 140 lb (63.5 kg)  08/12/20 140 lb (63.5 kg)    Physical Exam Vitals and nursing note reviewed.  Constitutional:      General: She is not in acute distress.    Appearance: She is well-developed. She is not diaphoretic.     Comments: Well-appearing, comfortable, cooperative  HENT:     Head: Normocephalic and atraumatic.  Eyes:     General:        Right eye: No discharge.        Left eye: No discharge.     Conjunctiva/sclera: Conjunctivae normal.  Cardiovascular:     Rate and Rhythm: Normal rate.  Pulmonary:     Effort: Pulmonary effort is normal.  Abdominal:     General: Abdomen is flat. Bowel sounds are increased.     Palpations: Abdomen is soft.     Tenderness: There is abdominal tenderness in the right upper quadrant and epigastric area. There is no guarding or rebound. Negative signs include Murphy's sign and McBurney's sign.     Hernia: No hernia is present.  Skin:  General: Skin is warm and dry.     Findings: No erythema or rash.  Neurological:     Mental Status: She is alert and oriented to person, place, and time.  Psychiatric:        Behavior: Behavior normal.     Comments: Well groomed, good eye contact, normal speech and thoughts      I have personally reviewed the radiology report from CT abdomen pelvis on 08/12/20.   CT ABDOMEN PELVIS W CONTRASTPerformed 08/12/2020 Final result  Study Result CLINICAL DATA: 55 year old with periumbilical abdominal pain. Evaluate for appendicitis.  EXAM: CT ABDOMEN AND PELVIS WITH CONTRAST  TECHNIQUE: Multidetector CT imaging of the abdomen and pelvis was performed using the standard protocol following bolus administration of intravenous contrast.  CONTRAST: 150mL OMNIPAQUE IOHEXOL 300  MG/ML SOLN  COMPARISON: CT 06/11/2011 and lumbar spine 02/18/2015  FINDINGS: Lower chest: Lung bases are clear.  Hepatobiliary: Again noted is a low-density area in the liver near the gallbladder. There was a similar finding on the exam from 2013 and suspect this is a benign finding such as focal fat. Again noted is focal fat near the falciform ligament. No suspicious hepatic lesion. Main portal venous system is patent. Normal appearance of the gallbladder. Common bile duct is dilated measuring close to 9 mm. Difficult to evaluate the common bile duct on the previous non contrast examination. Mild dilatation of the central intrahepatic bile ducts.  Pancreas: Unremarkable. No pancreatic ductal dilatation or surrounding inflammatory changes.  Spleen: Normal in size without focal abnormality.  Adrenals/Urinary Tract: Normal appearance of the adrenal glands. Right kidney is severely atrophic and this represents a change from 2013. Fullness of the left renal pelvis and left renal calices. Diffuse wall thickening in the urinary bladder.  Stomach/Bowel: Normal retrocecal appendix that extends into the upper abdomen near the liver. No evidence for acute inflammation. Multiple dilated loops of small bowel. Small bowel measures up to 3.6 cm in the left abdomen on sequence 2 image 42. Mild bowel wall thickening and mesenteric edema in the central pelvis on sequence 2, image 56. Small bowel wall thickening is best seen on the sagittal images, sequence 5 image 58. No significant abnormality involving the colon.  Vascular/Lymphatic: Extensive atherosclerotic calcifications in the abdominal aorta without aneurysm or dissection. Celiac trunk and SMA are patent. IMA appears to be patent. Atherosclerotic disease involving the renal arteries. Concern for high-grade stenosis in the right renal artery based on the right renal atrophy. Circumaortic left renal vein.  Reproductive: Status post  hysterectomy. No adnexal masses.  Other: Small amount ascites in the pelvis. Mesenteric edema particularly in the lower abdomen. Negative for free air.  Musculoskeletal: Grade 2 anterolisthesis at L4-L5 with loss of disc space at L4-L5. Approximately 10 cm anterolisthesis at L4-L5. Sclerosis and endplate changes at H6-P5. Mild anterolisthesis at L3-L4. Anterolisthesis in lumbar spine appears to be related to severe facet arthropathy.  IMPRESSION: 1. Dilated loops of small bowel with evidence of mesenteric edema and small bowel wall thickening in the pelvic region. Findings are concerning for enteritis and there could be a small bowel obstruction or ileus. Small amount of ascites. 2. Normal appearance of the appendix. 3. Diffuse wall thickening in the urinary bladder is concerning for cystitis. 4. Mild dilatation of the biliary system. If there is clinical concern for biliary obstruction consider further characterization with MRCP. 5. Chronic low-density areas in the liver most likely represent focal fat. 6. Right kidney is atrophic and this is new since  2013. Patient has extensive atherosclerotic disease and suspect hemodynamically significant right renal artery stenosis. 7. Fullness of the left renal collecting system without significant hydronephrosis. 8. 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  These results were called by telephone at the time of interpretation on 08/12/2020 at 12:41 pm to provider Margarette Canada , who verbally acknowledged these results.   Electronically Signed By: Markus Daft M.D. On: 08/12/2020 12:42     Results for orders placed or performed during the hospital encounter of 08/12/20  CBC with Differential  Result Value Ref Range   WBC 8.8 4.0 - 10.5 K/uL   RBC 4.05 3.87 - 5.11 MIL/uL   Hemoglobin 13.4 12.0 - 15.0 g/dL   HCT 39.7 36.0 - 46.0 %   MCV 98.0 80.0 - 100.0 fL   MCH 33.1 26.0 - 34.0 pg    MCHC 33.8 30.0 - 36.0 g/dL   RDW 13.3 11.5 - 15.5 %   Platelets 294 150 - 400 K/uL   nRBC 0.0 0.0 - 0.2 %   Neutrophils Relative % 76 %   Neutro Abs 6.8 1.7 - 7.7 K/uL   Lymphocytes Relative 10 %   Lymphs Abs 0.8 0.7 - 4.0 K/uL   Monocytes Relative 11 %   Monocytes Absolute 1.0 0.1 - 1.0 K/uL   Eosinophils Relative 1 %   Eosinophils Absolute 0.1 0.0 - 0.5 K/uL   Basophils Relative 1 %   Basophils Absolute 0.0 0.0 - 0.1 K/uL   Immature Granulocytes 1 %   Abs Immature Granulocytes 0.05 0.00 - 0.07 K/uL  Comprehensive metabolic panel  Result Value Ref Range   Sodium 132 (L) 135 - 145 mmol/L   Potassium 4.8 3.5 - 5.1 mmol/L   Chloride 99 98 - 111 mmol/L   CO2 23 22 - 32 mmol/L   Glucose, Bld 111 (H) 70 - 99 mg/dL   BUN 24 (H) 6 - 20 mg/dL   Creatinine, Ser 1.04 (H) 0.44 - 1.00 mg/dL   Calcium 9.7 8.9 - 10.3 mg/dL   Total Protein 8.0 6.5 - 8.1 g/dL   Albumin 4.5 3.5 - 5.0 g/dL   AST 25 15 - 41 U/L   ALT 19 0 - 44 U/L   Alkaline Phosphatase 45 38 - 126 U/L   Total Bilirubin 1.0 0.3 - 1.2 mg/dL   GFR, Estimated >60 >60 mL/min   Anion gap 10 5 - 15  Urinalysis, Complete w Microscopic Urine, Clean Catch  Result Value Ref Range   Color, Urine YELLOW YELLOW   APPearance CLEAR CLEAR   Specific Gravity, Urine 1.020 1.005 - 1.030   pH 5.5 5.0 - 8.0   Glucose, UA NEGATIVE NEGATIVE mg/dL   Hgb urine dipstick NEGATIVE NEGATIVE   Bilirubin Urine SMALL (A) NEGATIVE   Ketones, ur 15 (A) NEGATIVE mg/dL   Protein, ur NEGATIVE NEGATIVE mg/dL   Nitrite NEGATIVE NEGATIVE   Leukocytes,Ua NEGATIVE NEGATIVE   Squamous Epithelial / LPF 0-5 0 - 5   WBC, UA 0-5 0 - 5 WBC/hpf   RBC / HPF 6-10 0 - 5 RBC/hpf   Bacteria, UA FEW (A) NONE SEEN   Mucus PRESENT       Assessment & Plan:   Problem List Items Addressed This Visit   None   Visit Diagnoses    PUD (peptic ulcer disease)    -  Primary   Relevant Medications   omeprazole (PRILOSEC) 40 MG capsule   sucralfate (CARAFATE) 1  g tablet    Other Relevant Orders   Ambulatory referral to Gastroenterology   Generalized postprandial abdominal pain       Relevant Medications   omeprazole (PRILOSEC) 40 MG capsule   sucralfate (CARAFATE) 1 g tablet   Other Relevant Orders   Ambulatory referral to Gastroenterology   Epigastric abdominal pain       Relevant Orders   Ambulatory referral to Gastroenterology   Dilated bile duct       Relevant Orders   Ambulatory referral to Gastroenterology      recent acute GI problem with acute onset 08/12/20 with enteritis constellation of acute GI symptoms urgent care with CT Abdomen on chart in Epic with enteritis and dilated biliary duct w/o gallstones, other issues identified, she was treated w/ Cipro and Dicyclomine for acute symptoms with improvement. Now has more provoked epigastric PUD-like postprandial symptoms and also some radiating pain to R Shoulder with possible biliary problem.  Treating now for PUD - with PPI Omeprazole 40mg  daily for 4-8 weeks or longer, add Carafate PRN  May still take Dicyclomine PRN cramping - but seems improved. Finished Cipro  Referral to GI for consultation for further diagnostic and treatment plan. Requesting assistance to determine source and may need Gen Surgery if biliary disease is ultimately the problem.   Orders Placed This Encounter  Procedures  . Ambulatory referral to Gastroenterology    Referral Priority:   Routine    Referral Type:   Consultation    Referral Reason:   Specialty Services Required    Number of Visits Requested:   1     Meds ordered this encounter  Medications  . omeprazole (PRILOSEC) 40 MG capsule    Sig: Take 1 capsule (40 mg total) by mouth daily before breakfast.    Dispense:  30 capsule    Refill:  2  . sucralfate (CARAFATE) 1 g tablet    Sig: Take 1 tablet (1 g total) by mouth 4 (four) times daily -  with meals and at bedtime. As needed    Dispense:  40 tablet    Refill:  2      Follow up plan: Return if  symptoms worsen or fail to improve.   Nobie Putnam, [facility] 08/27/2020, 4:20 PM

## 2020-08-27 NOTE — Patient Instructions (Addendum)
Thank you for coming to the office today.  I am not sure the exact cause of your abdominal pain, however I am concerned that one significant possibility could be uncontrolled Acid Reflux (GERD) and may have developed an Ulcer (Peptic Ulcer of stomach).  Starting tonight before dinner and then again tomorrow every day in the morning before breakfast take Omeprazole 40mg  daily rx  Probably will need for about 8 weeks total if this ends up being an Ulcer.  Take other prescribed medicine Carafate (Sucralfate) as needed up to 4 times daily (3 meals and bedtime)  to coat stomach lining to ease symptoms, if it helps reduce symptoms then it is more likely to be due to acid and/or ulcer.  Referral to GI  [facility] [address], [place] [address] Phone: [phone]  Take medicines until you see them.  There is a chance this may still be gallbladder and may need a surgeon, hold off on this for now - unless severe acute abdominal pain nausea vomiting fever or worsening symptoms especially Right upper abdomen worse after eating.  DIET RECOMMENDATIONS - Avoid spicy, greasy, fried foods, also things like caffeine, dark chocolate, peppermint can worsen - Avoid large meals and late night snacks, also do not go more than 4-5 hours without a snack or meal (not eating will worsen reflux symptoms due to stomach acid) - You may also elevate the head of your bed at night to sleep at very slight incline to help reduce symptoms  If the problem improves but keeps coming back, we can discuss higher dose or longer course at next visit.  If symptoms are worsening or persistent despite treatment or develop any different severe esophagus or abdominal pain, unable to swallow solids or liquids, nausea, vomiting especially blood in vomit, fever/chills, or unintentional weight loss / no appetite, please follow-up sooner in office or seek more immediate medical  attention at hospital Emergency Department.  Regarding other medicines:  - STOP taking Ibuprofen, Advil, Motrin, Goody's / BC powder - DO NOT take without discussing with your doctor. These medicines can put you at high risk for future bleeding.  If need pain medicine, may take Tylenol Extra Strength (Acetaminophen) 500mg  tabs - take 1 to 2 tabs per dose (max 1000mg ) every 6-8 hours for pain (take regularly, don't skip a dose for next 7 days), max 24 hour daily dose is 6 tablets or 3000mg . In the future you can repeat the same everyday Tylenol course for 1-2 weeks at a time.   Please schedule a Follow-up Appointment to: Return if symptoms worsen or fail to improve.  If you have any other questions or concerns, please feel free to call the office or send a message through [place]. You may also schedule an earlier appointment if necessary.  Additionally, you may be receiving a survey about your experience at our office within a few days to 1 week by e-mail or mail. We value your feedback.  Nobie Putnam, DO [place]

## 2020-08-30 ENCOUNTER — Emergency Department: Payer: BC Managed Care – PPO

## 2020-08-30 ENCOUNTER — Other Ambulatory Visit: Payer: Self-pay

## 2020-08-30 ENCOUNTER — Encounter: Payer: Self-pay | Admitting: Intensive Care

## 2020-08-30 ENCOUNTER — Inpatient Hospital Stay
Admission: EM | Admit: 2020-08-30 | Discharge: 2020-09-01 | DRG: 390 | Disposition: A | Discharge status: Home / Self Care | Payer: BC Managed Care – PPO | Attending: Internal Medicine | Admitting: Internal Medicine

## 2020-08-30 DIAGNOSIS — Z28310 Unvaccinated for covid-19: Secondary | ICD-10-CM

## 2020-08-30 DIAGNOSIS — Z8541 Personal history of malignant neoplasm of cervix uteri: Secondary | ICD-10-CM

## 2020-08-30 DIAGNOSIS — I10 Essential (primary) hypertension: Secondary | ICD-10-CM | POA: Diagnosis present

## 2020-08-30 DIAGNOSIS — Z79899 Other long term (current) drug therapy: Secondary | ICD-10-CM

## 2020-08-30 DIAGNOSIS — F17210 Nicotine dependence, cigarettes, uncomplicated: Secondary | ICD-10-CM | POA: Diagnosis present

## 2020-08-30 DIAGNOSIS — K56609 Unspecified intestinal obstruction, unspecified as to partial versus complete obstruction: Secondary | ICD-10-CM | POA: Diagnosis not present

## 2020-08-30 DIAGNOSIS — Z20822 Contact with and (suspected) exposure to covid-19: Secondary | ICD-10-CM | POA: Diagnosis present

## 2020-08-30 DIAGNOSIS — R109 Unspecified abdominal pain: Secondary | ICD-10-CM

## 2020-08-30 DIAGNOSIS — K56600 Partial intestinal obstruction, unspecified as to cause: Principal | ICD-10-CM

## 2020-08-30 DIAGNOSIS — Z880 Allergy status to penicillin: Secondary | ICD-10-CM

## 2020-08-30 DIAGNOSIS — Z7982 Long term (current) use of aspirin: Secondary | ICD-10-CM

## 2020-08-30 DIAGNOSIS — K279 Peptic ulcer, site unspecified, unspecified as acute or chronic, without hemorrhage or perforation: Secondary | ICD-10-CM | POA: Diagnosis present

## 2020-08-30 DIAGNOSIS — Z720 Tobacco use: Secondary | ICD-10-CM | POA: Diagnosis present

## 2020-08-30 DIAGNOSIS — Z8249 Family history of ischemic heart disease and other diseases of the circulatory system: Secondary | ICD-10-CM

## 2020-08-30 DIAGNOSIS — A084 Viral intestinal infection, unspecified: Secondary | ICD-10-CM | POA: Diagnosis present

## 2020-08-30 DIAGNOSIS — K219 Gastro-esophageal reflux disease without esophagitis: Secondary | ICD-10-CM | POA: Diagnosis present

## 2020-08-30 DIAGNOSIS — Z885 Allergy status to narcotic agent status: Secondary | ICD-10-CM

## 2020-08-30 DIAGNOSIS — R1110 Vomiting, unspecified: Secondary | ICD-10-CM | POA: Diagnosis not present

## 2020-08-30 DIAGNOSIS — K529 Noninfective gastroenteritis and colitis, unspecified: Secondary | ICD-10-CM | POA: Diagnosis present

## 2020-08-30 LAB — CBC
HCT: 40.7 % (ref 36.0–46.0)
Hemoglobin: 13.8 g/dL (ref 12.0–15.0)
MCH: 32.9 pg (ref 26.0–34.0)
MCHC: 33.9 g/dL (ref 30.0–36.0)
MCV: 96.9 fL (ref 80.0–100.0)
Platelets: 308 10*3/uL (ref 150–400)
RBC: 4.2 MIL/uL (ref 3.87–5.11)
RDW: 13.1 % (ref 11.5–15.5)
WBC: 19 10*3/uL — ABNORMAL HIGH (ref 4.0–10.5)
nRBC: 0 % (ref 0.0–0.2)

## 2020-08-30 LAB — COMPREHENSIVE METABOLIC PANEL
ALT: 17 U/L (ref 0–44)
AST: 25 U/L (ref 15–41)
Albumin: 4.6 g/dL (ref 3.5–5.0)
Alkaline Phosphatase: 48 U/L (ref 38–126)
Anion gap: 10 (ref 5–15)
BUN: 25 mg/dL — ABNORMAL HIGH (ref 6–20)
CO2: 22 mmol/L (ref 22–32)
Calcium: 9.8 mg/dL (ref 8.9–10.3)
Chloride: 101 mmol/L (ref 98–111)
Creatinine, Ser: 1.02 mg/dL — ABNORMAL HIGH (ref 0.44–1.00)
GFR, Estimated: >60 mL/min (ref >60)
Glucose, Bld: 120 mg/dL — ABNORMAL HIGH (ref 70–99)
Potassium: 4.5 mmol/L (ref 3.5–5.1)
Sodium: 133 mmol/L — ABNORMAL LOW (ref 135–145)
Total Bilirubin: 0.6 mg/dL (ref 0.3–1.2)
Total Protein: 8.2 g/dL — ABNORMAL HIGH (ref 6.5–8.1)

## 2020-08-30 LAB — URINALYSIS, COMPLETE (UACMP) WITH MICROSCOPIC
Bacteria, UA: NONE SEEN
Bilirubin Urine: NEGATIVE
Glucose, UA: NEGATIVE mg/dL
Ketones, ur: 5 mg/dL — AB
Leukocytes,Ua: NEGATIVE
Nitrite: NEGATIVE
Protein, ur: NEGATIVE mg/dL
Specific Gravity, Urine: 1.025 (ref 1.005–1.030)
pH: 5 (ref 5.0–8.0)

## 2020-08-30 LAB — LIPASE, BLOOD: Lipase: 34 U/L (ref 11–51)

## 2020-08-30 LAB — LACTIC ACID, PLASMA
Lactic Acid, Venous: 0.7 mmol/L (ref 0.5–1.9)
Lactic Acid, Venous: 0.7 mmol/L (ref 0.5–1.9)

## 2020-08-30 MED ORDER — IOHEXOL 300 MG/ML  SOLN
75.0000 mL | Freq: Once | INTRAMUSCULAR | Status: AC | PRN
  Administered 2020-08-30: 75 mL via INTRAVENOUS

## 2020-08-30 MED ORDER — ACETAMINOPHEN 500 MG PO TABS: 1000.0000 mg | ORAL_TABLET | Freq: Four times a day (QID) | ORAL | Status: DC | PRN

## 2020-08-30 MED ORDER — PANTOPRAZOLE SODIUM 40 MG PO TBEC
80.0000 mg | DELAYED_RELEASE_TABLET | Freq: Every day | ORAL | Status: DC
  Administered 2020-08-31 – 2020-09-01 (×2): 80 mg via ORAL
  Filled 2020-08-30 (×2): qty 2

## 2020-08-30 MED ORDER — ONDANSETRON HCL 4 MG PO TABS
4.0000 mg | ORAL_TABLET | Freq: Four times a day (QID) | ORAL | Status: DC | PRN
Start: 2020-08-30 — End: 2020-09-01

## 2020-08-30 MED ORDER — LISINOPRIL 10 MG PO TABS
10.0000 mg | ORAL_TABLET | Freq: Every day | ORAL | Status: DC
  Administered 2020-08-31 – 2020-09-01 (×2): 10 mg via ORAL
  Filled 2020-08-30 (×2): qty 1

## 2020-08-30 MED ORDER — MORPHINE SULFATE (PF) 4 MG/ML IV SOLN
4.0000 mg | INTRAVENOUS | Status: DC | PRN
  Administered 2020-08-31: 4 mg via INTRAVENOUS
  Filled 2020-08-30: qty 1

## 2020-08-30 MED ORDER — MORPHINE SULFATE (PF) 4 MG/ML IV SOLN
4.0000 mg | Freq: Once | INTRAVENOUS | Status: AC
  Administered 2020-08-30: 4 mg via INTRAVENOUS
  Filled 2020-08-30: qty 1

## 2020-08-30 MED ORDER — SODIUM CHLORIDE 0.9 % IV SOLN: INTRAVENOUS | Status: DC

## 2020-08-30 MED ORDER — ONDANSETRON HCL 4 MG/2ML IJ SOLN
4.0000 mg | Freq: Once | INTRAMUSCULAR | Status: AC
  Administered 2020-08-30: 4 mg via INTRAVENOUS
  Filled 2020-08-30: qty 2

## 2020-08-30 MED ORDER — SODIUM CHLORIDE 0.9 % IV BOLUS
1000.0000 mL | Freq: Once | INTRAVENOUS | Status: AC
  Administered 2020-08-30: 1000 mL via INTRAVENOUS

## 2020-08-30 MED ORDER — METOPROLOL TARTRATE 25 MG PO TABS
25.0000 mg | ORAL_TABLET | Freq: Every day | ORAL | Status: DC
  Administered 2020-08-31 – 2020-09-01 (×2): 25 mg via ORAL
  Filled 2020-08-30 (×2): qty 1

## 2020-08-30 MED ORDER — ONDANSETRON HCL 4 MG/2ML IJ SOLN
4.0000 mg | Freq: Four times a day (QID) | INTRAMUSCULAR | Status: DC | PRN
  Administered 2020-08-31: 4 mg via INTRAVENOUS
  Filled 2020-08-30: qty 2

## 2020-08-30 MED ORDER — NICOTINE 21 MG/24HR TD PT24
21.0000 mg | MEDICATED_PATCH | Freq: Every day | TRANSDERMAL | Status: DC
  Administered 2020-08-30 – 2020-09-01 (×3): 21 mg via TRANSDERMAL
  Filled 2020-08-30 (×3): qty 1

## 2020-08-30 MED ORDER — CIPROFLOXACIN IN D5W 400 MG/200ML IV SOLN
400.0000 mg | Freq: Once | INTRAVENOUS | Status: AC
  Administered 2020-08-30: 400 mg via INTRAVENOUS
  Filled 2020-08-30: qty 200

## 2020-08-30 MED ORDER — ACETAMINOPHEN 650 MG RE SUPP: 650.0000 mg | Freq: Four times a day (QID) | RECTAL | Status: DC | PRN

## 2020-08-30 MED ORDER — METRONIDAZOLE IN NACL 5-0.79 MG/ML-% IV SOLN
500.0000 mg | Freq: Once | INTRAVENOUS | Status: AC
  Administered 2020-08-30: 500 mg via INTRAVENOUS
  Filled 2020-08-30: qty 100

## 2020-08-30 NOTE — ED Provider Notes (Signed)
[facility] Emergency Department Provider Note  ____________________________________________  Time seen: Approximately 5:12 PM  I have reviewed the triage vital signs and the nursing notes.   HISTORY  Chief Complaint Abdominal Pain    HPI Tina Chang is a 55 y.o. female who presents the emergency department complaining of right-sided abdominal pain.  Patient states that symptoms began roughly a month ago.  She was evaluated in urgent care 3 weeks ago, imaging at that time had revealed common bile duct dilation without evidence of acute obstruction, dilated bowel concerning for ileus versus early obstruction, findings consistent with enteritis.  Patient has been treated medically at that time, and followed up with her primary care and seemed to be improving symptomatically.  Patient states that she had a sudden worsening of symptoms today.  She is had nausea and vomiting denies any bilious or bloody emesis.  Patient is still having diarrhea with no concerns of constipation.  No bloody stools.  No reported fevers or chills.  It does appear that symptoms have been worsened postprandial specifically with meats.  Patient denies any urinary symptoms.  No chest pain or shortness of breath.  No complaints at this time.         Past Medical History:  Diagnosis Date  . Cervical cancer ([place])   . Hypertension     Patient Active Problem List   Diagnosis Date Noted  . SBO (small bowel obstruction) ([place]) 08/30/2020  . Enteritis 08/30/2020  . Atrophic kidney, acquired 08/13/2020  . Essential hypertension 02/05/2019  . Tobacco abuse 02/05/2019    Past Surgical History:  Procedure Laterality Date  . CERVICAL CONE BIOPSY  10/2010   cervical biopsy, cystoscopy, vaginal packing  . CERVIX REMOVAL    . DILATION AND CURETTAGE OF UTERUS    . 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 Removal of Bakri Balloon  10/2010    Prior to Admission  medications   Medication Sig Start Date End Date Taking? Authorizing Provider  aspirin EC 81 MG EC tablet Take 1 tablet (81 mg total) by mouth daily. 06/03/15  Yes Mody, Ulice Bold, MD  lisinopril (ZESTRIL) 20 MG tablet Take 1 tablet (20 mg total) by mouth daily. Patient taking differently: Take 10 mg by mouth daily. 08/30/19  Yes Karamalegos, Devonne Doughty, DO  metoprolol tartrate (LOPRESSOR) 25 MG tablet TAKE 1 TABLET BY MOUTH TWICE DAILY Patient taking differently: Take 25 mg by mouth daily. 08/26/20  Yes Karamalegos, Devonne Doughty, DO  omeprazole (PRILOSEC) 40 MG capsule Take 1 capsule (40 mg total) by mouth daily before breakfast. 08/27/20  Yes Karamalegos, Devonne Doughty, DO  sucralfate (CARAFATE) 1 g tablet Take 1 tablet (1 g total) by mouth 4 (four) times daily -  with meals and at bedtime. As needed 08/27/20  Yes Karamalegos, Devonne Doughty, DO  dicyclomine (BENTYL) 10 MG capsule Take 1 capsule (10 mg total) by mouth 4 (four) times daily -  before meals and at bedtime. Patient not taking: Reported on 08/30/2020 08/13/20   Olin Hauser, DO  ondansetron (ZOFRAN ODT) 8 MG disintegrating tablet Take 1 tablet (8 mg total) by mouth every 8 (eight) hours as needed for nausea or vomiting. Patient not taking: Reported on 08/30/2020 08/12/20   Margarette Canada, NP  gabapentin (NEURONTIN) 300 MG capsule Take 1 capsule (300 mg total) by mouth 3 (three) times daily. 08/30/19 08/12/20  Olin Hauser, DO    Allergies Codeine, Demerol [meperidine], and  Penicillins  Family History  Problem Relation Age of Onset  . Hypertension Mother   . Arrhythmia Mother   . CAD Father   . Diabetes Father   . Stroke Father   . Heart attack Brother 19  . Breast cancer Neg Hx     Social History Social History   Tobacco Use  . Smoking status: Current Every Day Smoker    Packs/day: 0.50    Years: 20.00    Pack years: 10.00    Types: Cigarettes  . Smokeless tobacco: Never Used  Vaping Use  . Vaping Use: Never used   Substance Use Topics  . Alcohol use: Yes    Alcohol/week: 10.0 standard drinks    Types: 3 Standard drinks or equivalent, 7 Glasses of wine per week  . Drug use: No     Review of Systems  Constitutional: No fever/chills Eyes: No visual changes. No discharge ENT: No upper respiratory complaints. Cardiovascular: no chest pain. Respiratory: no cough. No SOB. Gastrointestinal: Right upper and lower quadrant abdominal pain.  Positive for nausea and vomiting.  Positive diarrhea.  No constipation. Genitourinary: Negative for dysuria. No hematuria Musculoskeletal: Negative for musculoskeletal pain. Skin: Negative for rash, abrasions, lacerations, ecchymosis. Neurological: Negative for headaches, focal weakness or numbness.  10 System ROS otherwise negative.  ____________________________________________   PHYSICAL EXAM:  VITAL SIGNS: ED Triage Vitals [08/30/20 1705]  Enc Vitals Group     BP (!) 167/102     Pulse Rate (!) 103     Resp 18     Temp 98.1 F (36.7 C)     Temp Source Oral     SpO2 99 %     Weight 135 lb (61.2 kg)     Height 5\' 6"  (1.676 m)     Head Circumference      Peak Flow      Pain Score 10     Pain Loc      Pain Edu?      Excl. in [place]?      Constitutional: Alert and oriented. Well appearing and in no acute distress. Eyes: Conjunctivae are normal. PERRL. EOMI. Head: Atraumatic. ENT:      Ears:       Nose: No congestion/rhinnorhea.      Mouth/Throat: Mucous membranes are moist.  Neck: No stridor.    Cardiovascular: Normal rate, regular rhythm. Normal S1 and S2.  Good peripheral circulation. Respiratory: Normal respiratory effort without tachypnea or retractions. Lungs CTAB. Good air entry to the bases with no decreased or absent breath sounds. Gastrointestinal: No visible external abdominal wall findings.  Bowel sounds 4 quadrants.  Soft to palpation all quadrants.  Patient is tender diffusely on the right side starting roughly between upper and lower  quadrant extending from the lateral aspect of the abdominal wall towards the umbilicus and diffusely into the right upper quadrant.  There is some guarding in the right upper quadrant.  Positive for Murphy sign..  No rigidity.. No palpable masses. No distention. No CVA tenderness. Musculoskeletal: Full range of motion to all extremities. No gross deformities appreciated. Neurologic:  Normal speech and language. No gross focal neurologic deficits are appreciated.  Skin:  Skin is warm, dry and intact. No rash noted. Psychiatric: Mood and affect are normal. Speech and behavior are normal. Patient exhibits appropriate insight and judgement.   ____________________________________________   LABS (all labs ordered are listed, but only abnormal results are displayed)  Labs Reviewed  COMPREHENSIVE METABOLIC PANEL - Abnormal; Notable for  the following components:      Result Value   Sodium 133 (*)    Glucose, Bld 120 (*)    BUN 25 (*)    Creatinine, Ser 1.02 (*)    Total Protein 8.2 (*)    All other components within normal limits  CBC - Abnormal; Notable for the following components:   WBC 19.0 (*)    All other components within normal limits  URINALYSIS, COMPLETE (UACMP) WITH MICROSCOPIC - Abnormal; Notable for the following components:   Color, Urine YELLOW (*)    APPearance CLEAR (*)    Hgb urine dipstick SMALL (*)    Ketones, ur 5 (*)    All other components within normal limits  SARS CORONAVIRUS 2 (TAT 6-24 HRS)  C DIFFICILE QUICK SCREEN W PCR REFLEX  GASTROINTESTINAL PANEL BY PCR, STOOL (REPLACES STOOL CULTURE)  LIPASE, BLOOD  LACTIC ACID, PLASMA  LACTIC ACID, PLASMA  HIV ANTIBODY (ROUTINE TESTING W REFLEX)  BASIC METABOLIC PANEL  CBC   ____________________________________________  EKG   ____________________________________________  RADIOLOGY I personally viewed and evaluated these images as part of my medical decision making, as well as reviewing the written report by  the radiologist.  ED Provider Interpretation: Findings consistent with diffuse enteritis with narrowing of the transition between small intestine and large intestine concerning for partial small bowel obstruction   CT ABDOMEN PELVIS W CONTRAST  Result Date: 08/30/2020 CLINICAL DATA:  Acute, non localized abdominal pain, primarily on the right side. Nausea and vomiting. Diagnosed with enteritis and common bile duct dilatation 3 weeks ago with improving pain until today. No previous surgical procedures. EXAM: CT ABDOMEN AND PELVIS WITH CONTRAST TECHNIQUE: Multidetector CT imaging of the abdomen and pelvis was performed using the standard protocol following bolus administration of intravenous contrast. CONTRAST:  9mL OMNIPAQUE IOHEXOL 300 MG/ML  SOLN COMPARISON:  08/12/2020 FINDINGS: Lower chest: Stable mild elevation of the left hemidiaphragm. Otherwise, unremarkable. Hepatobiliary: Poorly distended gallbladder with medium density bile, compatible with sludge. Unremarkable liver. Stable focal fat deposition in the liver adjacent to the fossa for malignancy. Mild dilatation of the common duct and central intrahepatic ducts is again demonstrated, without significant change. Pancreas: Mild to moderate diffuse pancreatic atrophy without significant change. Spleen: Normal in size without focal abnormality. Adrenals/Urinary Tract: Again demonstrated is marked atrophy of the right kidney and mild compensatory hypertrophy of the left kidney. Unremarkable adrenal glands, ureters and urinary bladder. Stomach/Bowel: Moderate to marked dilatation of multiple small bowel loops filled with fluid and air. The degree of dilatation has increased significantly and the previously demonstrated wall thickening has significantly improved. A the distal small bowel is normal in caliber with mild-to-moderate low density wall thickening at the point of transition from dilated bowel loops in the right pelvis. Normal appearing appendix.  Moderately dilated stomach containing fluid and air. Sigmoid colon diverticulosis without evidence of diverticulitis. Vascular/Lymphatic: Extensive atheromatous arterial calcifications, including the origins of the renal arteries. The celiac axis, superior mesenteric artery and inferior mesenteric artery remain patent. No aneurysm. No enlarged lymph nodes. Reproductive: Uterus and bilateral adnexa are unremarkable. Other: No abdominal wall hernia or abnormality. No abdominopelvic ascites. Musculoskeletal: Extensive mid and lower lumbar spine facet degenerative changes are again demonstrated with associated grade 2 anterolisthesis at the L4-5 level with marked disc space narrowing, vacuum phenomena, discogenic sclerosis and moderate posterior disc bulging. IMPRESSION: 1. High-grade partial small-bowel obstruction with a transition point in the right pelvis. This appears to be due to residual enteritis involving the  distal ileum with associated wall thickening causing significant luminal narrowing. 2. Sigmoid diverticulosis. 3. Stable marked atrophy of the right kidney and mild compensatory hypertrophy of the left kidney. 4. Stable mild biliary ductal dilatation. 5. Extensive atheromatous arterial calcifications, including the origins of the renal arteries. 6. Stable grade 2 spondylolisthesis at the L4-5 level due to extensive facet degenerative changes. Electronically Signed   By: Claudie Revering M.D.   On: 08/30/2020 18:28    ____________________________________________    PROCEDURES  Procedure(s) performed:    Procedures    Medications  lisinopril (ZESTRIL) tablet 10 mg (has no administration in time range)  metoprolol tartrate (LOPRESSOR) tablet 25 mg (has no administration in time range)  pantoprazole (PROTONIX) EC tablet 80 mg (has no administration in time range)  acetaminophen (TYLENOL) tablet 1,000 mg (has no administration in time range)    Or  acetaminophen (TYLENOL) suppository 650 mg  (has no administration in time range)  ondansetron (ZOFRAN) tablet 4 mg (has no administration in time range)    Or  ondansetron (ZOFRAN) injection 4 mg (has no administration in time range)  morphine 4 MG/ML injection 4 mg (has no administration in time range)  nicotine (NICODERM CQ - dosed in mg/24 hours) patch 21 mg (21 mg Transdermal Patch Applied 08/30/20 2013)  0.9 %  sodium chloride infusion ( Intravenous New Bag/Given 08/30/20 2136)  ondansetron (ZOFRAN) injection 4 mg (4 mg Intravenous Given 08/30/20 1723)  sodium chloride 0.9 % bolus 1,000 mL (0 mLs Intravenous Stopped 08/30/20 1845)  morphine 4 MG/ML injection 4 mg (4 mg Intravenous Given 08/30/20 1723)  iohexol (OMNIPAQUE) 300 MG/ML solution 75 mL (75 mLs Intravenous Contrast Given 08/30/20 1752)  ciprofloxacin (CIPRO) IVPB 400 mg (0 mg Intravenous Stopped 08/30/20 2013)  metroNIDAZOLE (FLAGYL) IVPB 500 mg (0 mg Intravenous Stopped 08/30/20 2005)     ____________________________________________   INITIAL IMPRESSION / ASSESSMENT AND PLAN / ED COURSE  Pertinent labs & imaging results that were available during my care of the patient were reviewed by me and considered in my medical decision making (see chart for details).  Review of the [place] CSRS was performed in accordance of the [place] prior to dispensing any controlled drugs.           Patient's diagnosis is consistent with partial small bowel obstruction, enteritis.  Patient presented to emergency department with right-sided abdominal pain.  Patient has been dealing with nausea, vomiting, diarrhea, abdominal pain x1 month.  Patient was seen a month ago at urgent care, CT at that time revealed enteritis with concerns for ileus versus developing small bowel obstruction.  Patient had been treated with antibiotics at that time.  There was also finding that was concerning for possible deletion of the common bile duct.  This appears to be chronic given the imaging today.  Given the new worsening  symptoms, imaging was repeated and findings worsening enteritis with a partial small bowel obstruction at the transitory part of the intestines.  Patient will be treated with antibiotics for enteritis.  Currently she is not having significant ongoing vomiting and diarrhea and I have not established a NG tube given this fact..  This patient has been dealing with symptoms for a month, they are progressively worsening and having failed outpatient treatment I feel that patient warrants inpatient admission for further treatment of her enteritis and partial small bowel obstruction.     ____________________________________________  FINAL CLINICAL IMPRESSION(S) / ED DIAGNOSES  Final diagnoses:  Small bowel obstruction, partial ([place])  Enteritis      NEW MEDICATIONS STARTED DURING THIS VISIT:  ED Discharge Orders    None          This chart was dictated using voice recognition software/Dragon. Despite best efforts to proofread, errors can occur which can change the meaning. Any change was purely unintentional.    Brynda Peon 08/30/20 2159    Nance Pear, MD 08/30/20 [phone]

## 2020-08-30 NOTE — ED Triage Notes (Signed)
Patient c/o lower central abdominal pain with N/V/D X1 month. Has been seen for same

## 2020-08-30 NOTE — ED Notes (Signed)
Patient transported to CT 

## 2020-08-30 NOTE — H&P (Signed)
History and Physical   Tina Chang QVZ:[id number] DOB: Jan 15, 1966 DOA: 08/30/2020  PCP: Olin Hauser, DO  Patient coming from: home  I have personally briefly reviewed patient's old medical records in [place].  Chief Concern: abdominal pain  HPI: Tina Chang is a 55 y.o. female with medical history significant for hypertension, GERD, recent diagnosis of enteritis, presents to the emergency department for chief concerns of abdominal pain.  She reports that the abdominal pain started at approximately 1:30-1:45 pm, achy pain, 10/10, causing her to feel sick to her stomach, nausea, vomitted 2-3 times of bile.  She denies blood or black coffee-ground in her vomitus.  She reports that initially a similar pain had started about 3 weeks ago was intermittent.  However, since 1:30 pm on day of admission, the pain has been persistent and only resolved with the IV pain medications given in th ED.  She endorses diarrhea started again today 6 x diarrhea, light brown water.   She endorses chills, no subjective fever.  She denies cough, chest pain, shortness of breath, dysuria, hematuria, syncope, weight changes.  Social history: She lives at home with father.  She smokes tobacco about 1 ppd and started at the age 53.  She has occasional 1 glass of wine per night.  She denies recreational drug use. she works puts boxes in Engineer, drilling rooms.  Vaccination: not vaccinated for covid 19  ROS: Constitutional: no weight change, no fever ENT/Mouth: no sore throat, no rhinorrhea Eyes: no eye pain, no vision changes Cardiovascular: no chest pain, no dyspnea,  no edema, no palpitations Respiratory: no cough, no sputum, no wheezing Gastrointestinal: no nausea, no vomiting, no diarrhea, no constipation Genitourinary: no urinary incontinence, no dysuria, no hematuria Musculoskeletal: no arthralgias, no myalgias Skin: no skin lesions, no pruritus, Neuro: + weakness, no loss of consciousness, no  syncope Psych: no anxiety, no depression, + decrease appetite Heme/Lymph: no bruising, no bleeding  ED Course: Discussed with emergency medicine provider, patient requires hospitalization due to partial small bowel obstruction.  Temperature in the emergency department was remarkable for 98.1 Fahrenheit, respiration rate of 17, heart rate 85, blood pressure 154/99, SPO2 saturation of 97% on room air.  Labs in the emergency medicine department was remarkable for WBC of 19.  She was given IV morphine 4 mg once, Zofran 4 mg IV once, ciprofloxacin 400 mg IV once, Flagyl 500 mg IV once, saline 1 L bolus once per ED provider.  Assessment/Plan  Principal Problem:   SBO (small bowel obstruction) (HCC) Active Problems:   Essential hypertension   Tobacco abuse   Enteritis   # Small bowel obstruction-partial-suspect secondary to enteritis -N.p.o. except for sips with meds and ice chips -Normal saline IVF at 125 mL/h, 1 day ordered -I consulted general surgery, Dr. Celine Ahr will see the patient -As patient is not nauseous or vomiting at this time, no NG tube however if she becomes nauseous and starts to vomit, we will order NG tube placement  # Hypertension -Patient is prescribed lisinopril 20 mg daily, and metoprolol tartrate 25 mg twice daily however she takes the medications differently -Patient takes her lisinopril at half a tablet, and only takes 1 tablet of metoprolol tartrate 25 mg once daily -I have resumed lisinopril at 10 mg daily, and ordered metoprolol tartrate 25 mg once daily per patient request  # Enteritis with leukocytosis-check C. difficile and GI panel -No antibiotic pending C. difficile and GI panel  # GERD-resumed PPI  # Tobacco  abuse-nicotine patch ordered  As needed medications: Ondansetron, morphine 4 mg every 3 hours as needed for moderate and severe pain,  DVT prophylaxis-I have not initiated pharmacologic DVT prophylaxis as there may be a possibility of  surgery -Primary hospitalist to initiate DVT prophylaxis when appropriate  Chart reviewed.   On 08/27/2020: Presented to patient's PCP and was treated for PUD with omeprazole 40 mg daily before breakfast, sucralfate, and referral to GI  DVT prophylaxis: TED hose, no pharmacologic DVT prophylaxis due to possibility of surgery Code Status: Full code Diet: N.p.o. except for sips with meds and ice chips, advance to clear liquids as tolerated Family Communication: Updated son at bedside Disposition Plan: Pending clinical course Consults called: General surgery Admission status: Observation, MedSurg, with telemetry  Past Medical History:  Diagnosis Date  . Cervical cancer ([place])   . Hypertension    Past Surgical History:  Procedure Laterality Date  . CERVICAL CONE BIOPSY  10/2010   cervical biopsy, cystoscopy, vaginal packing  . CERVIX REMOVAL    . DILATION AND CURETTAGE OF UTERUS    . 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 Removal of Bakri Balloon  10/2010   Social History:  reports that she has been smoking cigarettes. She has a 10.00 pack-year smoking history. She has never used smokeless tobacco. She reports current alcohol use of about 10.0 standard drinks of alcohol per week. She reports that she does not use drugs.  Allergies  Allergen Reactions  . Codeine Itching  . Demerol [Meperidine] Anaphylaxis  . Penicillins Other (See Comments)    Reaction:  Unknown    Family History  Problem Relation Age of Onset  . Hypertension Mother   . Arrhythmia Mother   . CAD Father   . Diabetes Father   . Stroke Father   . Heart attack Brother 20  . Breast cancer Neg Hx    Family history: Family history reviewed and not pertinent  Prior to Admission medications   Medication Sig Start Date End Date Taking? Authorizing Provider  aspirin EC 81 MG EC tablet Take 1 tablet (81 mg total) by mouth daily. 06/03/15   Bettey Costa, MD  dicyclomine (BENTYL) 10 MG capsule  Take 1 capsule (10 mg total) by mouth 4 (four) times daily -  before meals and at bedtime. 08/13/20   Karamalegos, Devonne Doughty, DO  lisinopril (ZESTRIL) 20 MG tablet Take 1 tablet (20 mg total) by mouth daily. 08/30/19   Karamalegos, Devonne Doughty, DO  metoprolol tartrate (LOPRESSOR) 25 MG tablet TAKE 1 TABLET BY MOUTH TWICE DAILY 08/26/20   Parks Ranger, Devonne Doughty, DO  omeprazole (PRILOSEC) 40 MG capsule Take 1 capsule (40 mg total) by mouth daily before breakfast. 08/27/20   Parks Ranger, Devonne Doughty, DO  ondansetron (ZOFRAN ODT) 8 MG disintegrating tablet Take 1 tablet (8 mg total) by mouth every 8 (eight) hours as needed for nausea or vomiting. 08/12/20   Margarette Canada, NP  sucralfate (CARAFATE) 1 g tablet Take 1 tablet (1 g total) by mouth 4 (four) times daily -  with meals and at bedtime. As needed 08/27/20   Olin Hauser, DO  gabapentin (NEURONTIN) 300 MG capsule Take 1 capsule (300 mg total) by mouth 3 (three) times daily. 08/30/19 08/12/20  Olin Hauser, DO   Physical Exam: Vitals:   08/30/20 1705 08/30/20 1844  BP: (!) 167/102 (!) 154/99  Pulse: (!) 103 88  Resp: 18 17  Temp: 98.1 F (36.7 C)  TempSrc: Oral   SpO2: 99% 98%  Weight: 61.2 kg   Height: 5\' 6"  (1.676 m)    Constitutional: appears older than chronological age, NAD, calm, comfortable Eyes: PERRL, lids and conjunctivae normal ENMT: Mucous membranes are moist. Posterior pharynx clear of any exudate or lesions. Age-appropriate dentition. Hearing appropriate Neck: normal, supple, no masses, no thyromegaly Respiratory: clear to auscultation bilaterally, no wheezing, no crackles. Normal respiratory effort. No accessory muscle use.  Cardiovascular: Regular rate and rhythm, no murmurs / rubs / gallops. No extremity edema. 2+ pedal pulses. No carotid bruits.  Abdomen: no tenderness, no masses palpated, no hepatosplenomegaly. Bowel sounds positive.  Musculoskeletal: no clubbing / cyanosis. No joint deformity upper and  lower extremities. Good ROM, no contractures, no atrophy. Normal muscle tone.  Skin: no rashes, lesions, ulcers. No induration Neurologic: Sensation intact. Strength 5/5 in all 4.  Psychiatric: Normal judgment and insight. Alert and oriented x 3. Normal mood.   EKG: Not indicated  Imaging on Admission: I personally reviewed and I agree with radiologist reading as below.  CT ABDOMEN PELVIS W CONTRAST  Result Date: 08/30/2020 CLINICAL DATA:  Acute, non localized abdominal pain, primarily on the right side. Nausea and vomiting. Diagnosed with enteritis and common bile duct dilatation 3 weeks ago with improving pain until today. No previous surgical procedures. EXAM: CT ABDOMEN AND PELVIS WITH CONTRAST TECHNIQUE: Multidetector CT imaging of the abdomen and pelvis was performed using the standard protocol following bolus administration of intravenous contrast. CONTRAST:  63mL OMNIPAQUE IOHEXOL 300 MG/ML  SOLN COMPARISON:  08/12/2020 FINDINGS: Lower chest: Stable mild elevation of the left hemidiaphragm. Otherwise, unremarkable. Hepatobiliary: Poorly distended gallbladder with medium density bile, compatible with sludge. Unremarkable liver. Stable focal fat deposition in the liver adjacent to the fossa for malignancy. Mild dilatation of the common duct and central intrahepatic ducts is again demonstrated, without significant change. Pancreas: Mild to moderate diffuse pancreatic atrophy without significant change. Spleen: Normal in size without focal abnormality. Adrenals/Urinary Tract: Again demonstrated is marked atrophy of the right kidney and mild compensatory hypertrophy of the left kidney. Unremarkable adrenal glands, ureters and urinary bladder. Stomach/Bowel: Moderate to marked dilatation of multiple small bowel loops filled with fluid and air. The degree of dilatation has increased significantly and the previously demonstrated wall thickening has significantly improved. A the distal small bowel is normal  in caliber with mild-to-moderate low density wall thickening at the point of transition from dilated bowel loops in the right pelvis. Normal appearing appendix. Moderately dilated stomach containing fluid and air. Sigmoid colon diverticulosis without evidence of diverticulitis. Vascular/Lymphatic: Extensive atheromatous arterial calcifications, including the origins of the renal arteries. The celiac axis, superior mesenteric artery and inferior mesenteric artery remain patent. No aneurysm. No enlarged lymph nodes. Reproductive: Uterus and bilateral adnexa are unremarkable. Other: No abdominal wall hernia or abnormality. No abdominopelvic ascites. Musculoskeletal: Extensive mid and lower lumbar spine facet degenerative changes are again demonstrated with associated grade 2 anterolisthesis at the L4-5 level with marked disc space narrowing, vacuum phenomena, discogenic sclerosis and moderate posterior disc bulging. IMPRESSION: 1. High-grade partial small-bowel obstruction with a transition point in the right pelvis. This appears to be due to residual enteritis involving the distal ileum with associated wall thickening causing significant luminal narrowing. 2. Sigmoid diverticulosis. 3. Stable marked atrophy of the right kidney and mild compensatory hypertrophy of the left kidney. 4. Stable mild biliary ductal dilatation. 5. Extensive atheromatous arterial calcifications, including the origins of the renal arteries. 6. Stable grade 2 spondylolisthesis  at the L4-5 level due to extensive facet degenerative changes. Electronically Signed   By: Claudie Revering M.D.   On: 08/30/2020 18:28   Labs on Admission: I have personally reviewed following labs  CBC: Recent Labs  Lab 08/30/20 1711  WBC 19.0*  HGB 13.8  HCT 40.7  MCV 96.9  PLT 606   Basic Metabolic Panel: Recent Labs  Lab 08/30/20 1711  NA 133*  K 4.5  CL 101  CO2 22  GLUCOSE 120*  BUN 25*  CREATININE 1.02*  CALCIUM 9.8   GFR: Estimated  Creatinine Clearance: 57.7 mL/min (A) (by C-G formula based on SCr of 1.02 mg/dL (H)).  Liver Function Tests: Recent Labs  Lab 08/30/20 1711  AST 25  ALT 17  ALKPHOS 48  BILITOT 0.6  PROT 8.2*  ALBUMIN 4.6   Recent Labs  Lab 08/30/20 1711  LIPASE 34   Urine analysis:    Component Value Date/Time   COLORURINE YELLOW 08/12/2020 1019   APPEARANCEUR CLEAR 08/12/2020 1019   APPEARANCEUR Cloudy 02/07/2013 1241   LABSPEC 1.020 08/12/2020 1019   LABSPEC 1.020 02/07/2013 1241   PHURINE 5.5 08/12/2020 1019   GLUCOSEU NEGATIVE 08/12/2020 1019   GLUCOSEU Negative 02/07/2013 1241   HGBUR NEGATIVE 08/12/2020 1019   BILIRUBINUR SMALL (A) 08/12/2020 1019   BILIRUBINUR Negative 02/07/2013 1241   KETONESUR 15 (A) 08/12/2020 1019   PROTEINUR NEGATIVE 08/12/2020 1019   NITRITE NEGATIVE 08/12/2020 1019   LEUKOCYTESUR NEGATIVE 08/12/2020 1019   LEUKOCYTESUR Negative 02/07/2013 1241   Louis Gaw N Zoe Goonan D.O. [facility]  If 7PM-7AM, please contact overnight-coverage provider If 7AM-7PM, please contact day coverage provider [email or web address]  08/30/2020, 8:21 PM

## 2020-08-31 DIAGNOSIS — K529 Noninfective gastroenteritis and colitis, unspecified: Secondary | ICD-10-CM

## 2020-08-31 DIAGNOSIS — R1110 Vomiting, unspecified: Secondary | ICD-10-CM | POA: Diagnosis not present

## 2020-08-31 DIAGNOSIS — K219 Gastro-esophageal reflux disease without esophagitis: Secondary | ICD-10-CM | POA: Diagnosis not present

## 2020-08-31 DIAGNOSIS — A084 Viral intestinal infection, unspecified: Secondary | ICD-10-CM | POA: Diagnosis not present

## 2020-08-31 DIAGNOSIS — F17210 Nicotine dependence, cigarettes, uncomplicated: Secondary | ICD-10-CM | POA: Diagnosis not present

## 2020-08-31 DIAGNOSIS — Z7982 Long term (current) use of aspirin: Secondary | ICD-10-CM | POA: Diagnosis not present

## 2020-08-31 DIAGNOSIS — Z8541 Personal history of malignant neoplasm of cervix uteri: Secondary | ICD-10-CM | POA: Diagnosis not present

## 2020-08-31 DIAGNOSIS — R109 Unspecified abdominal pain: Secondary | ICD-10-CM | POA: Diagnosis not present

## 2020-08-31 DIAGNOSIS — Z885 Allergy status to narcotic agent status: Secondary | ICD-10-CM | POA: Diagnosis not present

## 2020-08-31 DIAGNOSIS — Z8249 Family history of ischemic heart disease and other diseases of the circulatory system: Secondary | ICD-10-CM | POA: Diagnosis not present

## 2020-08-31 DIAGNOSIS — Z20822 Contact with and (suspected) exposure to covid-19: Secondary | ICD-10-CM | POA: Diagnosis not present

## 2020-08-31 DIAGNOSIS — K56600 Partial intestinal obstruction, unspecified as to cause: Secondary | ICD-10-CM | POA: Diagnosis not present

## 2020-08-31 DIAGNOSIS — Z880 Allergy status to penicillin: Secondary | ICD-10-CM | POA: Diagnosis not present

## 2020-08-31 DIAGNOSIS — K279 Peptic ulcer, site unspecified, unspecified as acute or chronic, without hemorrhage or perforation: Secondary | ICD-10-CM | POA: Diagnosis not present

## 2020-08-31 DIAGNOSIS — I10 Essential (primary) hypertension: Secondary | ICD-10-CM | POA: Diagnosis not present

## 2020-08-31 DIAGNOSIS — K56609 Unspecified intestinal obstruction, unspecified as to partial versus complete obstruction: Secondary | ICD-10-CM | POA: Diagnosis not present

## 2020-08-31 DIAGNOSIS — Z79899 Other long term (current) drug therapy: Secondary | ICD-10-CM | POA: Diagnosis not present

## 2020-08-31 DIAGNOSIS — Z28310 Unvaccinated for covid-19: Secondary | ICD-10-CM | POA: Diagnosis not present

## 2020-08-31 LAB — HIV ANTIBODY (ROUTINE TESTING W REFLEX): HIV Screen 4th Generation wRfx: NONREACTIVE

## 2020-08-31 LAB — GASTROINTESTINAL PANEL BY PCR, STOOL (REPLACES STOOL CULTURE)

## 2020-08-31 LAB — BASIC METABOLIC PANEL
Anion gap: 6 (ref 5–15)
BUN: 18 mg/dL (ref 6–20)
CO2: 23 mmol/L (ref 22–32)
Calcium: 8.8 mg/dL — ABNORMAL LOW (ref 8.9–10.3)
Chloride: 108 mmol/L (ref 98–111)
Creatinine, Ser: 0.89 mg/dL (ref 0.44–1.00)
GFR, Estimated: >60 mL/min (ref >60)
Glucose, Bld: 99 mg/dL (ref 70–99)
Potassium: 4.1 mmol/L (ref 3.5–5.1)
Sodium: 137 mmol/L (ref 135–145)

## 2020-08-31 LAB — CBC
HCT: 35.5 % — ABNORMAL LOW (ref 36.0–46.0)
Hemoglobin: 11.9 g/dL — ABNORMAL LOW (ref 12.0–15.0)
MCH: 32.9 pg (ref 26.0–34.0)
MCHC: 33.5 g/dL (ref 30.0–36.0)
MCV: 98.1 fL (ref 80.0–100.0)
Platelets: 243 10*3/uL (ref 150–400)
RBC: 3.62 MIL/uL — ABNORMAL LOW (ref 3.87–5.11)
RDW: 13 % (ref 11.5–15.5)
WBC: 6.7 10*3/uL (ref 4.0–10.5)
nRBC: 0 % (ref 0.0–0.2)

## 2020-08-31 LAB — C DIFFICILE QUICK SCREEN W PCR REFLEX
C Diff antigen: NEGATIVE
C Diff interpretation: NOT DETECTED
C Diff toxin: NEGATIVE

## 2020-08-31 LAB — SARS CORONAVIRUS 2 (TAT 6-24 HRS): SARS Coronavirus 2: NEGATIVE

## 2020-08-31 MED ORDER — HYDROCODONE-ACETAMINOPHEN 5-325 MG PO TABS
1.0000 | ORAL_TABLET | Freq: Four times a day (QID) | ORAL | Status: DC | PRN
Start: 2020-08-31 — End: 2020-09-01
  Administered 2020-08-31: 1 via ORAL
  Filled 2020-08-31: qty 1

## 2020-08-31 NOTE — Progress Notes (Signed)
Progress Note    Tina Chang  QQP:[id number] DOB: January 01, 1966  DOA: 08/30/2020 PCP: Olin Hauser, DO    Brief Narrative:     Medical records reviewed and are as summarized below:  Tina Chang is an 55 y.o. female with medical history significant for hypertension, GERD, recent diagnosis of enteritis, presents to the emergency department for chief concerns of abdominal pain.  Recent abx use.    Assessment/Plan:   Principal Problem:   SBO (small bowel obstruction) (HCC) Active Problems:   Essential hypertension   Tobacco abuse   Enteritis   SBO: High-grade partial small-bowel obstruction with a transition point in the right pelvis. This appears to be due to residual enteritis involving the distal ileum with associated wall thickening causing significant luminal narrowing -N.p.o. except for sips with meds and ice chips -IVF -- general surgery consulted but does not appear patient needs surgery currently -patient is not nauseous or vomiting at this time--- if she becomes nauseous and starts to vomit, we will order NG tube placement - denies prior intra-abdominal surgery (did have cervical cancer)  Hypertension -resume meds how patient takes at home (not how prescribed)   Enteritis with leukocytosis -check C. difficile and GI panel -recent abx use outpatient (2 weeks ago)  GERD -PPI  Tobacco abuse -nicotine patch  Leukocytosis -? Reactive as resolved   Family Communication/Anticipated D/C date and plan/Code Status   DVT prophylaxis: scds Code Status: Full Code.  Disposition Plan: Status is: Observation  The patient will require care spanning > 2 midnights and should be moved to inpatient because: Inpatient level of care appropriate due to severity of illness  Dispo: The patient is from: Home              Anticipated d/c is to: Home              Patient currently is not medically stable to d/c.   Difficult to place patient No          Medical Consultants:   General surgery   Subjective:   No nausea States she is having BMs (reminder her that we need to collect sample)  Objective:    Vitals:   08/30/20 2045 08/30/20 2140 08/31/20 0449 08/31/20 0914  BP: (!) 149/87 137/75 137/83 139/75  Pulse: 80 78 79 72  Resp: 16 16 18 16   Temp: 98.7 F (37.1 C) 98.1 F (36.7 C) 98.8 F (37.1 C) 98.1 F (36.7 C)  TempSrc: Oral Oral Oral Oral  SpO2: 94% 100% 98% 98%  Weight:  60.4 kg    Height:  5\' 6"  (1.676 m)     No intake or output data in the 24 hours ending 08/31/20 0936 Filed Weights   08/30/20 1705 08/30/20 2140  Weight: 61.2 kg 60.4 kg    Exam:  General: Appearance:    Well developed, well nourished female in no acute distress     Lungs:      respirations unlabored  Heart:    Normal heart rate. Normal rhythm. No murmurs, rubs, or gallops.   MS:   All extremities are intact.   Neurologic:   Awake, alert, oriented x 3. No apparent focal neurological           defect.     Data Reviewed:   I have personally reviewed following labs and imaging studies:  Labs: Labs show the following:   Basic Metabolic Panel: Recent Labs  Lab 08/30/20 1711 08/31/20 0546  NA 133* 137  K 4.5 4.1  CL 101 108  CO2 22 23  GLUCOSE 120* 99  BUN 25* 18  CREATININE 1.02* 0.89  CALCIUM 9.8 8.8*   GFR Estimated Creatinine Clearance: 66.1 mL/min (by C-G formula based on SCr of 0.89 mg/dL). Liver Function Tests: Recent Labs  Lab 08/30/20 1711  AST 25  ALT 17  ALKPHOS 48  BILITOT 0.6  PROT 8.2*  ALBUMIN 4.6   Recent Labs  Lab 08/30/20 1711  LIPASE 34   No results for input(s): AMMONIA in the last 168 hours. Coagulation profile No results for input(s): INR, PROTIME in the last 168 hours.  CBC: Recent Labs  Lab 08/30/20 1711 08/31/20 0546  WBC 19.0* 6.7  HGB 13.8 11.9*  HCT 40.7 35.5*  MCV 96.9 98.1  PLT 308 243   Cardiac Enzymes: No results for input(s): CKTOTAL, CKMB, CKMBINDEX, TROPONINI  in the last 168 hours. BNP (last 3 results) No results for input(s): PROBNP in the last 8760 hours. CBG: No results for input(s): GLUCAP in the last 168 hours. D-Dimer: No results for input(s): DDIMER in the last 72 hours. Hgb A1c: No results for input(s): HGBA1C in the last 72 hours. Lipid Profile: No results for input(s): CHOL, HDL, LDLCALC, TRIG, CHOLHDL, LDLDIRECT in the last 72 hours. Thyroid function studies: No results for input(s): TSH, T4TOTAL, T3FREE, THYROIDAB in the last 72 hours.  Invalid input(s): FREET3 Anemia work up: No results for input(s): VITAMINB12, FOLATE, FERRITIN, TIBC, IRON, RETICCTPCT in the last 72 hours. Sepsis Labs: Recent Labs  Lab 08/30/20 1711 08/30/20 1718 08/30/20 1903 08/31/20 0546  WBC 19.0*  --   --  6.7  LATICACIDVEN  --  0.7 0.7  --     Microbiology Recent Results (from the past 240 hour(s))  SARS CORONAVIRUS 2 (TAT 6-24 HRS) Nasopharyngeal Nasopharyngeal Swab     Status: None   Collection Time: 08/30/20  7:03 PM   Specimen: Nasopharyngeal Swab  Result Value Ref Range Status   SARS Coronavirus 2 NEGATIVE NEGATIVE Final    Comment: (NOTE) SARS-CoV-2 target nucleic acids are NOT DETECTED.  The SARS-CoV-2 RNA is generally detectable in upper and lower respiratory specimens during the acute phase of infection. Negative results do not preclude SARS-CoV-2 infection, do not rule out co-infections with other pathogens, and should not be used as the sole basis for treatment or other patient management decisions. Negative results must be combined with clinical observations, patient history, and epidemiological information. The expected result is Negative.  Fact Sheet for Patients: SugarRoll.be  Fact Sheet for Healthcare Providers: https://www.woods-mathews.com/  This test is not yet approved or cleared by the [place] FDA and  has been authorized for detection and/or diagnosis of SARS-CoV-2  by FDA under an Emergency Use Authorization (EUA). This EUA will remain  in effect (meaning this test can be used) for the duration of the COVID-19 declaration under Se ction 564(b)(1) of the Act, 21 U.S.C. section 360bbb-3(b)(1), unless the authorization is terminated or revoked sooner.  Performed at [facility], [place] [address], [place], [place] [address]     Procedures and diagnostic studies:  CT ABDOMEN PELVIS W CONTRAST  Result Date: 08/30/2020 CLINICAL DATA:  Acute, non localized abdominal pain, primarily on the right side. Nausea and vomiting. Diagnosed with enteritis and common bile duct dilatation 3 weeks ago with improving pain until today. No previous surgical procedures. EXAM: CT ABDOMEN AND PELVIS WITH CONTRAST TECHNIQUE: Multidetector CT imaging of the abdomen and pelvis  was performed using the standard protocol following bolus administration of intravenous contrast. CONTRAST:  2mL OMNIPAQUE IOHEXOL 300 MG/ML  SOLN COMPARISON:  08/12/2020 FINDINGS: Lower chest: Stable mild elevation of the left hemidiaphragm. Otherwise, unremarkable. Hepatobiliary: Poorly distended gallbladder with medium density bile, compatible with sludge. Unremarkable liver. Stable focal fat deposition in the liver adjacent to the fossa for malignancy. Mild dilatation of the common duct and central intrahepatic ducts is again demonstrated, without significant change. Pancreas: Mild to moderate diffuse pancreatic atrophy without significant change. Spleen: Normal in size without focal abnormality. Adrenals/Urinary Tract: Again demonstrated is marked atrophy of the right kidney and mild compensatory hypertrophy of the left kidney. Unremarkable adrenal glands, ureters and urinary bladder. Stomach/Bowel: Moderate to marked dilatation of multiple small bowel loops filled with fluid and air. The degree of dilatation has increased significantly and the previously demonstrated wall thickening has significantly  improved. A the distal small bowel is normal in caliber with mild-to-moderate low density wall thickening at the point of transition from dilated bowel loops in the right pelvis. Normal appearing appendix. Moderately dilated stomach containing fluid and air. Sigmoid colon diverticulosis without evidence of diverticulitis. Vascular/Lymphatic: Extensive atheromatous arterial calcifications, including the origins of the renal arteries. The celiac axis, superior mesenteric artery and inferior mesenteric artery remain patent. No aneurysm. No enlarged lymph nodes. Reproductive: Uterus and bilateral adnexa are unremarkable. Other: No abdominal wall hernia or abnormality. No abdominopelvic ascites. Musculoskeletal: Extensive mid and lower lumbar spine facet degenerative changes are again demonstrated with associated grade 2 anterolisthesis at the L4-5 level with marked disc space narrowing, vacuum phenomena, discogenic sclerosis and moderate posterior disc bulging. IMPRESSION: 1. High-grade partial small-bowel obstruction with a transition point in the right pelvis. This appears to be due to residual enteritis involving the distal ileum with associated wall thickening causing significant luminal narrowing. 2. Sigmoid diverticulosis. 3. Stable marked atrophy of the right kidney and mild compensatory hypertrophy of the left kidney. 4. Stable mild biliary ductal dilatation. 5. Extensive atheromatous arterial calcifications, including the origins of the renal arteries. 6. Stable grade 2 spondylolisthesis at the L4-5 level due to extensive facet degenerative changes. Electronically Signed   By: Claudie Revering M.D.   On: 08/30/2020 18:28    Medications:   . lisinopril  10 mg Oral Daily  . metoprolol tartrate  25 mg Oral Daily  . nicotine  21 mg Transdermal Daily  . pantoprazole  80 mg Oral Daily   Continuous Infusions: . sodium chloride 125 mL/hr at 08/31/20 0505     LOS: 0 days   Geradine Girt  [facility]   How to contact the [facility] Attending or Consulting provider [place] or covering provider during after hours [place], for this patient?  1. Check the care team in [facility] and look for a) attending/consulting [facility] provider listed and b) the [facility] team listed 2. Log into [email or web address] and use [place]'s universal password to access. If you do not have the password, please contact the hospital operator. 3. Locate the [facility] provider you are looking for under [facility] and page to a number that you can be directly reached. 4. If you still have difficulty reaching the provider, please page the [facility] (Director on Call) for the Hospitalists listed on amion for assistance.  08/31/2020, 9:36 AM

## 2020-08-31 NOTE — Progress Notes (Signed)
Patient reports marked improvement in pain, only needed 1 dose PRN hydrocodone. Denied nausea throughout day. Able to tolerate clear liquids, states she is not ready to advance to full liquids but will notify staff.

## 2020-08-31 NOTE — Consult Note (Signed)
Reason for Consult: SBO Referring Physician: Rupert Stacks, DO (hospital medicine)  Tina Chang is an 55 y.o. female.  HPI: She has a medical history significant for hypertension, GERD, recent diagnosis of enteritis, presents to the emergency department for chief concerns of abdominal pain.  She reports that the abdominal pain started at approximately 1:30-1:45 pm, achy pain, 10/10, causing her to feel sick to her stomach, nausea, vomitted 2-3 times of bile.  She denies blood or black coffee-ground in her vomitus. She reports that she has had diarrhea, 6 episodes a day, as of the time of admission, essentially light brown water, according to her.  A CT scan performed in the emergency department was interpreted by the radiologist as demonstrating a high-grade partial small bowel obstruction with a transition point in the right pelvis, which appeared to be due to residual enteritis involving the distal ileum with associated wall thickening causing significant luminal narrowing.  She recently had enteritis and is followed by Dr. Parks Ranger as an outpatient for her primary care.  His recent clinic note from August 27, 2020 describes a previous visit for the same problem.  She was evaluated in urgent care on March 22 and a CT scan had demonstrated a variety of issues including enteritis and biliary dysfunction.  The visit on April 6 showed improvement in some symptoms but overall, the problem remained unresolved.  Dicyclomine seems to improve the cramping and her nausea and vomiting at that time had been alleviated.  Today, she reports that her pain has improved.  She is passing gas.  She has not had any further nausea or vomiting.  No further episodes of diarrhea since admission.  She is hungry.  Past Medical History:  Diagnosis Date  . Cervical cancer ([place])   . Hypertension     Past Surgical History:  Procedure Laterality Date  . CERVICAL CONE BIOPSY  10/2010   cervical biopsy, cystoscopy, vaginal  packing  . CERVIX REMOVAL    . DILATION AND CURETTAGE OF UTERUS    . 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 Removal of Bakri Balloon  10/2010    Family History  Problem Relation Age of Onset  . Hypertension Mother   . Arrhythmia Mother   . CAD Father   . Diabetes Father   . Stroke Father   . Heart attack Brother 40  . Breast cancer Neg Hx     Social History:  reports that she has been smoking cigarettes. She has a 10.00 pack-year smoking history. She has never used smokeless tobacco. She reports current alcohol use of about 10.0 standard drinks of alcohol per week. She reports that she does not use drugs.  Allergies:  Allergies  Allergen Reactions  . Codeine Itching  . Demerol [Meperidine] Anaphylaxis  . Penicillins Other (See Comments)    Reaction:  Unknown     Medications: I have reviewed the patient's current medications.  Results for orders placed or performed during the hospital encounter of 08/30/20 (from the past 48 hour(s))  Lipase, blood     Status: None   Collection Time: 08/30/20  5:11 PM  Result Value Ref Range   Lipase 34 11 - 51 U/L    Comment: Performed at [facility], [address] [place], [place] [address]  Comprehensive metabolic panel     Status: Abnormal   Collection Time: 08/30/20  5:11 PM  Result Value Ref Range   Sodium 133 (L) 135 -  145 mmol/L   Potassium 4.5 3.5 - 5.1 mmol/L   Chloride 101 98 - 111 mmol/L   CO2 22 22 - 32 mmol/L   Glucose, Bld 120 (H) 70 - 99 mg/dL    Comment: Glucose reference range applies only to samples taken after fasting for at least 8 hours.   BUN 25 (H) 6 - 20 mg/dL   Creatinine, Ser 1.02 (H) 0.44 - 1.00 mg/dL   Calcium 9.8 8.9 - 10.3 mg/dL   Total Protein 8.2 (H) 6.5 - 8.1 g/dL   Albumin 4.6 3.5 - 5.0 g/dL   AST 25 15 - 41 U/L   ALT 17 0 - 44 U/L   Alkaline Phosphatase 48 38 - 126 U/L   Total Bilirubin 0.6 0.3 - 1.2 mg/dL   GFR, Estimated >60 >60 mL/min    Comment:  (NOTE) Calculated using the CKD-EPI Creatinine Equation (2021)    Anion gap 10 5 - 15    Comment: Performed at [facility], [address] [place], [place] [address]  CBC     Status: Abnormal   Collection Time: 08/30/20  5:11 PM  Result Value Ref Range   WBC 19.0 (H) 4.0 - 10.5 K/uL   RBC 4.20 3.87 - 5.11 MIL/uL   Hemoglobin 13.8 12.0 - 15.0 g/dL   HCT 40.7 36.0 - 46.0 %   MCV 96.9 80.0 - 100.0 fL   MCH 32.9 26.0 - 34.0 pg   MCHC 33.9 30.0 - 36.0 g/dL   RDW 13.1 11.5 - 15.5 %   Platelets 308 150 - 400 K/uL   nRBC 0.0 0.0 - 0.2 %    Comment: Performed at [facility], [address] [place], [place] [address]  Urinalysis, Complete w Microscopic     Status: Abnormal   Collection Time: 08/30/20  5:11 PM  Result Value Ref Range   Color, Urine YELLOW (A) YELLOW   APPearance CLEAR (A) CLEAR   Specific Gravity, Urine 1.025 1.005 - 1.030   pH 5.0 5.0 - 8.0   Glucose, UA NEGATIVE NEGATIVE mg/dL   Hgb urine dipstick SMALL (A) NEGATIVE   Bilirubin Urine NEGATIVE NEGATIVE   Ketones, ur 5 (A) NEGATIVE mg/dL   Protein, ur NEGATIVE NEGATIVE mg/dL   Nitrite NEGATIVE NEGATIVE   Leukocytes,Ua NEGATIVE NEGATIVE   RBC / HPF 0-5 0 - 5 RBC/hpf   WBC, UA 0-5 0 - 5 WBC/hpf   Bacteria, UA NONE SEEN NONE SEEN   Squamous Epithelial / LPF 0-5 0 - 5    Comment: Performed at [facility], [address] [place], [place] [address]  Lactic acid, plasma     Status: None   Collection Time: 08/30/20  5:18 PM  Result Value Ref Range   Lactic Acid, Venous 0.7 0.5 - 1.9 mmol/L    Comment: Performed at [facility], [place], [place] [address]  Lactic acid, plasma     Status: None   Collection Time: 08/30/20  7:03 PM  Result Value Ref Range   Lactic Acid, Venous 0.7 0.5 - 1.9 mmol/L    Comment: Performed at [facility], [address] [place], [place] [address]  SARS CORONAVIRUS 2 (TAT 6-24 HRS) Nasopharyngeal Nasopharyngeal Swab      Status: None   Collection Time: 08/30/20  7:03 PM   Specimen: Nasopharyngeal Swab  Result Value Ref Range   SARS Coronavirus 2 NEGATIVE NEGATIVE    Comment: (NOTE) SARS-CoV-2 target nucleic acids are NOT  DETECTED.  The SARS-CoV-2 RNA is generally detectable in upper and lower respiratory specimens during the acute phase of infection. Negative results do not preclude SARS-CoV-2 infection, do not rule out co-infections with other pathogens, and should not be used as the sole basis for treatment or other patient management decisions. Negative results must be combined with clinical observations, patient history, and epidemiological information. The expected result is Negative.  Fact Sheet for Patients: SugarRoll.be  Fact Sheet for Healthcare Providers: https://www.woods-mathews.com/  This test is not yet approved or cleared by the [place] FDA and  has been authorized for detection and/or diagnosis of SARS-CoV-2 by FDA under an Emergency Use Authorization (EUA). This EUA will remain  in effect (meaning this test can be used) for the duration of the COVID-19 declaration under Se ction 564(b)(1) of the Act, 21 U.S.C. section 360bbb-3(b)(1), unless the authorization is terminated or revoked sooner.  Performed at [facility], [place] [address] [place], [place] [address]   Basic metabolic panel     Status: Abnormal   Collection Time: 08/31/20  5:46 AM  Result Value Ref Range   Sodium 137 135 - 145 mmol/L   Potassium 4.1 3.5 - 5.1 mmol/L   Chloride 108 98 - 111 mmol/L   CO2 23 22 - 32 mmol/L   Glucose, Bld 99 70 - 99 mg/dL    Comment: Glucose reference range applies only to samples taken after fasting for at least 8 hours.   BUN 18 6 - 20 mg/dL   Creatinine, Ser 0.89 0.44 - 1.00 mg/dL   Calcium 8.8 (L) 8.9 - 10.3 mg/dL   GFR, Estimated >60 >60 mL/min    Comment: (NOTE) Calculated using the CKD-EPI Creatinine Equation (2021)     Anion gap 6 5 - 15    Comment: Performed at [facility], [address] [place], [place] [address]  CBC     Status: Abnormal   Collection Time: 08/31/20  5:46 AM  Result Value Ref Range   WBC 6.7 4.0 - 10.5 K/uL   RBC 3.62 (L) 3.87 - 5.11 MIL/uL   Hemoglobin 11.9 (L) 12.0 - 15.0 g/dL   HCT 35.5 (L) 36.0 - 46.0 %   MCV 98.1 80.0 - 100.0 fL   MCH 32.9 26.0 - 34.0 pg   MCHC 33.5 30.0 - 36.0 g/dL   RDW 13.0 11.5 - 15.5 %   Platelets 243 150 - 400 K/uL   nRBC 0.0 0.0 - 0.2 %    Comment: Performed at [facility], [address] [place], [place] [address]  HIV Antibody (routine testing w rflx)     Status: None   Collection Time: 08/31/20  5:46 AM  Result Value Ref Range   HIV Screen 4th Generation wRfx Non Reactive Non Reactive    Comment: Performed at [facility], [place] [address], [place],  [address]    CT ABDOMEN PELVIS W CONTRAST  Result Date: 08/30/2020 CLINICAL DATA:  Acute, non localized abdominal pain, primarily on the right side. Nausea and vomiting. Diagnosed with enteritis and common bile duct dilatation 3 weeks ago with improving pain until today. No previous surgical procedures. EXAM: CT ABDOMEN AND PELVIS WITH CONTRAST TECHNIQUE: Multidetector CT imaging of the abdomen and pelvis was performed using the standard protocol following bolus administration of intravenous contrast. CONTRAST:  11mL OMNIPAQUE IOHEXOL 300 MG/ML  SOLN COMPARISON:  08/12/2020 FINDINGS: Lower chest: Stable mild elevation of the left hemidiaphragm. Otherwise, unremarkable. Hepatobiliary: Poorly distended  gallbladder with medium density bile, compatible with sludge. Unremarkable liver. Stable focal fat deposition in the liver adjacent to the fossa for malignancy. Mild dilatation of the common duct and central intrahepatic ducts is again demonstrated, without significant change. Pancreas: Mild to moderate diffuse pancreatic atrophy without significant change. Spleen: Normal in size  without focal abnormality. Adrenals/Urinary Tract: Again demonstrated is marked atrophy of the right kidney and mild compensatory hypertrophy of the left kidney. Unremarkable adrenal glands, ureters and urinary bladder. Stomach/Bowel: Moderate to marked dilatation of multiple small bowel loops filled with fluid and air. The degree of dilatation has increased significantly and the previously demonstrated wall thickening has significantly improved. A the distal small bowel is normal in caliber with mild-to-moderate low density wall thickening at the point of transition from dilated bowel loops in the right pelvis. Normal appearing appendix. Moderately dilated stomach containing fluid and air. Sigmoid colon diverticulosis without evidence of diverticulitis. Vascular/Lymphatic: Extensive atheromatous arterial calcifications, including the origins of the renal arteries. The celiac axis, superior mesenteric artery and inferior mesenteric artery remain patent. No aneurysm. No enlarged lymph nodes. Reproductive: Uterus and bilateral adnexa are unremarkable. Other: No abdominal wall hernia or abnormality. No abdominopelvic ascites. Musculoskeletal: Extensive mid and lower lumbar spine facet degenerative changes are again demonstrated with associated grade 2 anterolisthesis at the L4-5 level with marked disc space narrowing, vacuum phenomena, discogenic sclerosis and moderate posterior disc bulging. IMPRESSION: 1. High-grade partial small-bowel obstruction with a transition point in the right pelvis. This appears to be due to residual enteritis involving the distal ileum with associated wall thickening causing significant luminal narrowing. 2. Sigmoid diverticulosis. 3. Stable marked atrophy of the right kidney and mild compensatory hypertrophy of the left kidney. 4. Stable mild biliary ductal dilatation. 5. Extensive atheromatous arterial calcifications, including the origins of the renal arteries. 6. Stable grade 2  spondylolisthesis at the L4-5 level due to extensive facet degenerative changes. Electronically Signed   By: Claudie Revering M.D.   On: 08/30/2020 18:28    Review of Systems  All other systems reviewed and are negative.  Blood pressure (!) 154/87, pulse 81, temperature 98 F (36.7 C), temperature source Oral, resp. rate 18, height 5\' 6"  (1.676 m), weight 60.4 kg, SpO2 98 %. Physical Exam Constitutional:      General: She is not in acute distress.    Appearance: She is normal weight.  HENT:     Head: Normocephalic and atraumatic.     Nose: Nose normal.     Mouth/Throat:     Mouth: Mucous membranes are moist.  Eyes:     General: No scleral icterus.       Right eye: No discharge.        Left eye: No discharge.     Conjunctiva/sclera: Conjunctivae normal.  Cardiovascular:     Rate and Rhythm: Normal rate and regular rhythm.  Pulmonary:     Effort: Pulmonary effort is normal. No respiratory distress.  Abdominal:     General: Abdomen is flat. There is no distension.     Palpations: Abdomen is soft.     Tenderness: There is no abdominal tenderness.  Genitourinary:    Comments: Deferred Musculoskeletal:     Right lower leg: No edema.     Left lower leg: No edema.  Skin:    General: Skin is warm and dry.  Neurological:     General: No focal deficit present.     Mental Status: She is alert and oriented to person, place,  and time.  Psychiatric:        Mood and Affect: Mood normal.        Behavior: Behavior normal.     Assessment/Plan: This is a 55 year old woman who has had enteritis since March 22.  She did have some improvement in her symptoms, but they recurred yesterday.  CT scan in the emergency department was read as representing a high-grade partial small bowel obstruction secondary to residual enteritis.  She has never had abdominal surgery, making adhesive disease unlikely.  At this time, she is not experiencing any nausea or vomiting.  Her abdomen is extremely soft,  nontender, and nondistended.  She is passing gas.  I do not see any evidence, clinically, of a bowel obstruction. --Initiate clear liquid diet --Advance diet as tolerated --Continue supportive care with IV hydration, electrolyte repletion as needed, and antibiotic therapy for enteritis. --No indication for surgical intervention, we will sign off.  Fredirick Maudlin 08/31/2020, 3:40 PM

## 2020-09-01 ENCOUNTER — Encounter: Payer: Self-pay | Admitting: *Deleted

## 2020-09-01 DIAGNOSIS — K56609 Unspecified intestinal obstruction, unspecified as to partial versus complete obstruction: Secondary | ICD-10-CM | POA: Diagnosis not present

## 2020-09-01 MED ORDER — DICYCLOMINE HCL 10 MG PO CAPS: 10.0000 mg | ORAL_CAPSULE | Freq: Three times a day (TID) | ORAL | 0 refills | Status: DC | PRN

## 2020-09-01 NOTE — Discharge Summary (Signed)
Physician Discharge Summary  Tina Chang RJJ:[id number] DOB: 20-Feb-1966 DOA: 08/30/2020  PCP: Olin Hauser, DO  Admit date: 08/30/2020 Discharge date: 09/01/2020  Admitted From: Home Disposition:  Home   Recommendations for Outpatient Follow-up:  1. Follow up with PCP in 1-2 weeks  Home Health: No Equipment/Devices: None Discharge Condition: Stable CODE STATUS: Full Diet recommendation: Regular/bland Brief/Interim Summary:  55 y.o. female with medical history significant for hypertension, GERD, recent diagnosis of enteritis, presents to the emergency department for chief concerns of abdominal pain.  She reports that the abdominal pain started at approximately 1:30-1:45 pm, achy pain, 10/10, causing her to feel sick to her stomach, nausea, vomitted 2-3 times of bile.  She denies blood or black coffee-ground in her vomitus.  She reports that initially a similar pain had started about 3 weeks ago was intermittent.  However, since 1:30 pm on day of admission, the pain has been persistent and only resolved with the IV pain medications given in th ED.  On the day of discharge patient's diet was advanced.  She tolerated regular diet for lunch without issues.  No nausea no vomiting endorsed.  Surgery saw patient but signed off as patient had soft belly.  Patient had GI PCR panel and C. difficile complete, both were negative.  Suspect viral gastroenteritis and resultant partial SBO.  Patient is recommended to adhere to a soft diet and small meals at time of discharge and seek medical attention should her symptoms resume.  She expressed understanding.  She is discharged in stable condition.  Discharge Diagnoses:  Principal Problem:   SBO (small bowel obstruction) (HCC) Active Problems:   Essential hypertension   Tobacco abuse   Enteritis  # Small bowel obstruction-partial-suspect secondary to enteritis Enteritis felt to be viral in nature GI PCR and C. difficile  negative Tolerating regular diet at the time of discharge without nausea or vomiting Discharge home, adhere to soft diet, small meals, adequate hydration Seek medical attention if abdominal pain recurs  # Hypertension -Can resume home regimen at time of discharge  # Enteritis with leukocytosis Resolved the time of discharge.  C. difficile and GI PCR panel negative.  No antibiotics indicated.  Suspect viral gastroenteritis  # GERD-resumed PPI  # Tobacco abuse-nicotine patch ordered  Discharge Instructions  Discharge Instructions    Diet - low sodium heart healthy   Complete by: As directed    Bland diet, small meals   Increase activity slowly   Complete by: As directed      Allergies as of 09/01/2020      Reactions   Codeine Itching   Demerol [meperidine] Anaphylaxis   Penicillins Other (See Comments)   Reaction:  Unknown       Medication List    TAKE these medications   aspirin 81 MG EC tablet Take 1 tablet (81 mg total) by mouth daily.   dicyclomine 10 MG capsule Commonly known as: Bentyl Take 1 capsule (10 mg total) by mouth 3 (three) times daily as needed for up to 7 days for spasms. What changed:   when to take this  reasons to take this   lisinopril 20 MG tablet Commonly known as: ZESTRIL Take 1 tablet (20 mg total) by mouth daily. What changed: how much to take   metoprolol tartrate 25 MG tablet Commonly known as: LOPRESSOR TAKE 1 TABLET BY MOUTH TWICE DAILY What changed: when to take this   omeprazole 40 MG capsule Commonly known as: PRILOSEC Take 1 capsule (40  mg total) by mouth daily before breakfast.   ondansetron 8 MG disintegrating tablet Commonly known as: Zofran ODT Take 1 tablet (8 mg total) by mouth every 8 (eight) hours as needed for nausea or vomiting.   sucralfate 1 g tablet Commonly known as: Carafate Take 1 tablet (1 g total) by mouth 4 (four) times daily -  with meals and at bedtime. As needed       Allergies  Allergen  Reactions  . Codeine Itching  . Demerol [Meperidine] Anaphylaxis  . Penicillins Other (See Comments)    Reaction:  Unknown     Consultations:  General surgery   Procedures/Studies: CT ABDOMEN PELVIS W CONTRAST  Result Date: 08/30/2020 CLINICAL DATA:  Acute, non localized abdominal pain, primarily on the right side. Nausea and vomiting. Diagnosed with enteritis and common bile duct dilatation 3 weeks ago with improving pain until today. No previous surgical procedures. EXAM: CT ABDOMEN AND PELVIS WITH CONTRAST TECHNIQUE: Multidetector CT imaging of the abdomen and pelvis was performed using the standard protocol following bolus administration of intravenous contrast. CONTRAST:  59mL OMNIPAQUE IOHEXOL 300 MG/ML  SOLN COMPARISON:  08/12/2020 FINDINGS: Lower chest: Stable mild elevation of the left hemidiaphragm. Otherwise, unremarkable. Hepatobiliary: Poorly distended gallbladder with medium density bile, compatible with sludge. Unremarkable liver. Stable focal fat deposition in the liver adjacent to the fossa for malignancy. Mild dilatation of the common duct and central intrahepatic ducts is again demonstrated, without significant change. Pancreas: Mild to moderate diffuse pancreatic atrophy without significant change. Spleen: Normal in size without focal abnormality. Adrenals/Urinary Tract: Again demonstrated is marked atrophy of the right kidney and mild compensatory hypertrophy of the left kidney. Unremarkable adrenal glands, ureters and urinary bladder. Stomach/Bowel: Moderate to marked dilatation of multiple small bowel loops filled with fluid and air. The degree of dilatation has increased significantly and the previously demonstrated wall thickening has significantly improved. A the distal small bowel is normal in caliber with mild-to-moderate low density wall thickening at the point of transition from dilated bowel loops in the right pelvis. Normal appearing appendix. Moderately dilated stomach  containing fluid and air. Sigmoid colon diverticulosis without evidence of diverticulitis. Vascular/Lymphatic: Extensive atheromatous arterial calcifications, including the origins of the renal arteries. The celiac axis, superior mesenteric artery and inferior mesenteric artery remain patent. No aneurysm. No enlarged lymph nodes. Reproductive: Uterus and bilateral adnexa are unremarkable. Other: No abdominal wall hernia or abnormality. No abdominopelvic ascites. Musculoskeletal: Extensive mid and lower lumbar spine facet degenerative changes are again demonstrated with associated grade 2 anterolisthesis at the L4-5 level with marked disc space narrowing, vacuum phenomena, discogenic sclerosis and moderate posterior disc bulging. IMPRESSION: 1. High-grade partial small-bowel obstruction with a transition point in the right pelvis. This appears to be due to residual enteritis involving the distal ileum with associated wall thickening causing significant luminal narrowing. 2. Sigmoid diverticulosis. 3. Stable marked atrophy of the right kidney and mild compensatory hypertrophy of the left kidney. 4. Stable mild biliary ductal dilatation. 5. Extensive atheromatous arterial calcifications, including the origins of the renal arteries. 6. Stable grade 2 spondylolisthesis at the L4-5 level due to extensive facet degenerative changes. Electronically Signed   By: Claudie Revering M.D.   On: 08/30/2020 18:28   CT ABDOMEN PELVIS W CONTRAST  Result Date: 08/12/2020 CLINICAL DATA:  55 year old with periumbilical abdominal pain. Evaluate for appendicitis. EXAM: CT ABDOMEN AND PELVIS WITH CONTRAST TECHNIQUE: Multidetector CT imaging of the abdomen and pelvis was performed using the standard protocol following bolus administration  of intravenous contrast. CONTRAST:  157mL OMNIPAQUE IOHEXOL 300 MG/ML  SOLN COMPARISON:  CT 06/11/2011 and lumbar spine 02/18/2015 FINDINGS: Lower chest: Lung bases are clear. Hepatobiliary: Again noted is  a low-density area in the liver near the gallbladder. There was a similar finding on the exam from 2013 and suspect this is a benign finding such as focal fat. Again noted is focal fat near the falciform ligament. No suspicious hepatic lesion. Main portal venous system is patent. Normal appearance of the gallbladder. Common bile duct is dilated measuring close to 9 mm. Difficult to evaluate the common bile duct on the previous non contrast examination. Mild dilatation of the central intrahepatic bile ducts. Pancreas: Unremarkable. No pancreatic ductal dilatation or surrounding inflammatory changes. Spleen: Normal in size without focal abnormality. Adrenals/Urinary Tract: Normal appearance of the adrenal glands. Right kidney is severely atrophic and this represents a change from 2013. Fullness of the left renal pelvis and left renal calices. Diffuse wall thickening in the urinary bladder. Stomach/Bowel: Normal retrocecal appendix that extends into the upper abdomen near the liver. No evidence for acute inflammation. Multiple dilated loops of small bowel. Small bowel measures up to 3.6 cm in the left abdomen on sequence 2 image 42. Mild bowel wall thickening and mesenteric edema in the central pelvis on sequence 2, image 56. Small bowel wall thickening is best seen on the sagittal images, sequence 5 image 58. No significant abnormality involving the colon. Vascular/Lymphatic: Extensive atherosclerotic calcifications in the abdominal aorta without aneurysm or dissection. Celiac trunk and SMA are patent. IMA appears to be patent. Atherosclerotic disease involving the renal arteries. Concern for high-grade stenosis in the right renal artery based on the right renal atrophy. Circumaortic left renal vein. Reproductive: Status post hysterectomy. No adnexal masses. Other: Small amount ascites in the pelvis. Mesenteric edema particularly in the lower abdomen. Negative for free air. Musculoskeletal: Grade 2 anterolisthesis at  L4-L5 with loss of disc space at L4-L5. Approximately 10 cm anterolisthesis at L4-L5. Sclerosis and endplate changes at D1-S9. Mild anterolisthesis at L3-L4. Anterolisthesis in lumbar spine appears to be related to severe facet arthropathy. IMPRESSION: 1. Dilated loops of small bowel with evidence of mesenteric edema and small bowel wall thickening in the pelvic region. Findings are concerning for enteritis and there could be a small bowel obstruction or ileus. Small amount of ascites. 2. Normal appearance of the appendix. 3. Diffuse wall thickening in the urinary bladder is concerning for cystitis. 4. Mild dilatation of the biliary system. If there is clinical concern for biliary obstruction consider further characterization with MRCP. 5. Chronic low-density areas in the liver most likely represent focal fat. 6. Right kidney is atrophic and this is new since 2013. Patient has extensive atherosclerotic disease and suspect hemodynamically significant right renal artery stenosis. 7. Fullness of the left renal collecting system without significant hydronephrosis. 8. 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 These results were called by telephone at the time of interpretation on 08/12/2020 at 12:41 pm to provider Margarette Canada , who verbally acknowledged these results. Electronically Signed   By: Markus Daft M.D.   On: 08/12/2020 12:42    (Echo, Carotid, EGD, Colonoscopy, ERCP)    Subjective: Seen and examined on the day of discharge.  Stable, no distress.  Tolerating regular diet.  Discharge Exam: Vitals:   09/01/20 1050 09/01/20 1055  BP:  134/76  Pulse: 72 70  Resp: 16 16  Temp: 98.2 F (36.8 C) 97.9 F (  36.6 C)  SpO2:  100%   Vitals:   09/01/20 0421 09/01/20 0854 09/01/20 1050 09/01/20 1055  BP: 115/69 (!) 144/80  134/76  Pulse: 70 72 72 70  Resp: 16 16 16 16   Temp: 98.2 F (36.8 C) 98.3 F (36.8 C) 98.2 F (36.8 C) 97.9 F (36.6 C)  TempSrc:  Oral Oral Oral Oral  SpO2: 97% 98%  100%  Weight:      Height:        General: Pt is alert, awake, not in acute distress Cardiovascular: RRR, S1/S2 +, no rubs, no gallops Respiratory: CTA bilaterally, no wheezing, no rhonchi Abdominal: Soft, NT, ND, bowel sounds + Extremities: no edema, no cyanosis    The results of significant diagnostics from this hospitalization (including imaging, microbiology, ancillary and laboratory) are listed below for reference.     Microbiology: Recent Results (from the past 240 hour(s))  C Difficile Quick Screen w PCR reflex     Status: None   Collection Time: 08/30/20  5:45 PM   Specimen: STOOL  Result Value Ref Range Status   C Diff antigen NEGATIVE NEGATIVE Final   C Diff toxin NEGATIVE NEGATIVE Final   C Diff interpretation No C. difficile detected.  Final    Comment: Performed at [facility], [address] [place], [place] [address]  Gastrointestinal Panel by PCR , Stool     Status: None   Collection Time: 08/30/20  5:45 PM   Specimen: Stool  Result Value Ref Range Status   Campylobacter species NOT DETECTED NOT DETECTED Final   Plesimonas shigelloides NOT DETECTED NOT DETECTED Final   Salmonella species NOT DETECTED NOT DETECTED Final   Yersinia enterocolitica NOT DETECTED NOT DETECTED Final   Vibrio species NOT DETECTED NOT DETECTED Final   Vibrio cholerae NOT DETECTED NOT DETECTED Final   Enteroaggregative E coli (EAEC) NOT DETECTED NOT DETECTED Final   Enteropathogenic E coli (EPEC) NOT DETECTED NOT DETECTED Final   Enterotoxigenic E coli (ETEC) NOT DETECTED NOT DETECTED Final   Shiga like toxin producing E coli (STEC) NOT DETECTED NOT DETECTED Final   Shigella/Enteroinvasive E coli (EIEC) NOT DETECTED NOT DETECTED Final   Cryptosporidium NOT DETECTED NOT DETECTED Final   Cyclospora cayetanensis NOT DETECTED NOT DETECTED Final   Entamoeba histolytica NOT DETECTED NOT DETECTED Final   Giardia lamblia NOT DETECTED NOT  DETECTED Final   Adenovirus F40/41 NOT DETECTED NOT DETECTED Final   Astrovirus NOT DETECTED NOT DETECTED Final   Norovirus GI/GII NOT DETECTED NOT DETECTED Final   Rotavirus A NOT DETECTED NOT DETECTED Final   Sapovirus (I, II, IV, and V) NOT DETECTED NOT DETECTED Final    Comment: Performed at [facility], [address] [place], [place] [address]  SARS CORONAVIRUS 2 (TAT 6-24 HRS) Nasopharyngeal Nasopharyngeal Swab     Status: None   Collection Time: 08/30/20  7:03 PM   Specimen: Nasopharyngeal Swab  Result Value Ref Range Status   SARS Coronavirus 2 NEGATIVE NEGATIVE Final    Comment: (NOTE) SARS-CoV-2 target nucleic acids are NOT DETECTED.  The SARS-CoV-2 RNA is generally detectable in upper and lower respiratory specimens during the acute phase of infection. Negative results do not preclude SARS-CoV-2 infection, do not rule out co-infections with other pathogens, and should not be used as the sole basis for treatment or other patient management decisions. Negative results must be combined with clinical observations, patient history, and epidemiological information. The expected result is Negative.  Fact Sheet for Patients:  SugarRoll.be  Fact Sheet for Healthcare Providers: https://www.woods-mathews.com/  This test is not yet approved or cleared by the [place] FDA and  has been authorized for detection and/or diagnosis of SARS-CoV-2 by FDA under an Emergency Use Authorization (EUA). This EUA will remain  in effect (meaning this test can be used) for the duration of the COVID-19 declaration under Se ction 564(b)(1) of the Act, 21 U.S.C. section 360bbb-3(b)(1), unless the authorization is terminated or revoked sooner.  Performed at [facility], [place] [address], [place], [place] [address]      Labs: BNP (last 3 results) No results for input(s): BNP in the last 8760 hours. Basic Metabolic Panel: Recent  Labs  Lab 08/30/20 1711 08/31/20 0546  NA 133* 137  K 4.5 4.1  CL 101 108  CO2 22 23  GLUCOSE 120* 99  BUN 25* 18  CREATININE 1.02* 0.89  CALCIUM 9.8 8.8*   Liver Function Tests: Recent Labs  Lab 08/30/20 1711  AST 25  ALT 17  ALKPHOS 48  BILITOT 0.6  PROT 8.2*  ALBUMIN 4.6   Recent Labs  Lab 08/30/20 1711  LIPASE 34   No results for input(s): AMMONIA in the last 168 hours. CBC: Recent Labs  Lab 08/30/20 1711 08/31/20 0546  WBC 19.0* 6.7  HGB 13.8 11.9*  HCT 40.7 35.5*  MCV 96.9 98.1  PLT 308 243   Cardiac Enzymes: No results for input(s): CKTOTAL, CKMB, CKMBINDEX, TROPONINI in the last 168 hours. BNP: Invalid input(s): POCBNP CBG: No results for input(s): GLUCAP in the last 168 hours. D-Dimer No results for input(s): DDIMER in the last 72 hours. Hgb A1c No results for input(s): HGBA1C in the last 72 hours. Lipid Profile No results for input(s): CHOL, HDL, LDLCALC, TRIG, CHOLHDL, LDLDIRECT in the last 72 hours. Thyroid function studies No results for input(s): TSH, T4TOTAL, T3FREE, THYROIDAB in the last 72 hours.  Invalid input(s): FREET3 Anemia work up No results for input(s): VITAMINB12, FOLATE, FERRITIN, TIBC, IRON, RETICCTPCT in the last 72 hours. Urinalysis    Component Value Date/Time   COLORURINE YELLOW (A) 08/30/2020 1711   APPEARANCEUR CLEAR (A) 08/30/2020 1711   APPEARANCEUR Cloudy 02/07/2013 1241   LABSPEC 1.025 08/30/2020 1711   LABSPEC 1.020 02/07/2013 1241   PHURINE 5.0 08/30/2020 1711   GLUCOSEU NEGATIVE 08/30/2020 1711   GLUCOSEU Negative 02/07/2013 1241   HGBUR SMALL (A) 08/30/2020 1711   BILIRUBINUR NEGATIVE 08/30/2020 1711   BILIRUBINUR Negative 02/07/2013 1241   KETONESUR 5 (A) 08/30/2020 1711   PROTEINUR NEGATIVE 08/30/2020 1711   NITRITE NEGATIVE 08/30/2020 1711   LEUKOCYTESUR NEGATIVE 08/30/2020 1711   LEUKOCYTESUR Negative 02/07/2013 1241   Sepsis Labs Invalid input(s): PROCALCITONIN,  WBC,   LACTICIDVEN Microbiology Recent Results (from the past 240 hour(s))  C Difficile Quick Screen w PCR reflex     Status: None   Collection Time: 08/30/20  5:45 PM   Specimen: STOOL  Result Value Ref Range Status   C Diff antigen NEGATIVE NEGATIVE Final   C Diff toxin NEGATIVE NEGATIVE Final   C Diff interpretation No C. difficile detected.  Final    Comment: Performed at [facility], [address] [place], [place] [address]  Gastrointestinal Panel by PCR , Stool     Status: None   Collection Time: 08/30/20  5:45 PM   Specimen: Stool  Result Value Ref Range Status   Campylobacter species NOT DETECTED NOT DETECTED Final   Plesimonas shigelloides NOT DETECTED NOT DETECTED Final  Salmonella species NOT DETECTED NOT DETECTED Final   Yersinia enterocolitica NOT DETECTED NOT DETECTED Final   Vibrio species NOT DETECTED NOT DETECTED Final   Vibrio cholerae NOT DETECTED NOT DETECTED Final   Enteroaggregative E coli (EAEC) NOT DETECTED NOT DETECTED Final   Enteropathogenic E coli (EPEC) NOT DETECTED NOT DETECTED Final   Enterotoxigenic E coli (ETEC) NOT DETECTED NOT DETECTED Final   Shiga like toxin producing E coli (STEC) NOT DETECTED NOT DETECTED Final   Shigella/Enteroinvasive E coli (EIEC) NOT DETECTED NOT DETECTED Final   Cryptosporidium NOT DETECTED NOT DETECTED Final   Cyclospora cayetanensis NOT DETECTED NOT DETECTED Final   Entamoeba histolytica NOT DETECTED NOT DETECTED Final   Giardia lamblia NOT DETECTED NOT DETECTED Final   Adenovirus F40/41 NOT DETECTED NOT DETECTED Final   Astrovirus NOT DETECTED NOT DETECTED Final   Norovirus GI/GII NOT DETECTED NOT DETECTED Final   Rotavirus A NOT DETECTED NOT DETECTED Final   Sapovirus (I, II, IV, and V) NOT DETECTED NOT DETECTED Final    Comment: Performed at [facility], [address] [place], [place] [address]  SARS CORONAVIRUS 2 (TAT 6-24 HRS) Nasopharyngeal Nasopharyngeal Swab     Status: None   Collection  Time: 08/30/20  7:03 PM   Specimen: Nasopharyngeal Swab  Result Value Ref Range Status   SARS Coronavirus 2 NEGATIVE NEGATIVE Final    Comment: (NOTE) SARS-CoV-2 target nucleic acids are NOT DETECTED.  The SARS-CoV-2 RNA is generally detectable in upper and lower respiratory specimens during the acute phase of infection. Negative results do not preclude SARS-CoV-2 infection, do not rule out co-infections with other pathogens, and should not be used as the sole basis for treatment or other patient management decisions. Negative results must be combined with clinical observations, patient history, and epidemiological information. The expected result is Negative.  Fact Sheet for Patients: SugarRoll.be  Fact Sheet for Healthcare Providers: https://www.woods-mathews.com/  This test is not yet approved or cleared by the [place] FDA and  has been authorized for detection and/or diagnosis of SARS-CoV-2 by FDA under an Emergency Use Authorization (EUA). This EUA will remain  in effect (meaning this test can be used) for the duration of the COVID-19 declaration under Se ction 564(b)(1) of the Act, 21 U.S.C. section 360bbb-3(b)(1), unless the authorization is terminated or revoked sooner.  Performed at [facility], [place] [address], [place], [place] [address]      Time coordinating discharge: Over 30 minutes  SIGNED:   Sidney Ace, MD  [facility] 09/01/2020, 3:04 PM Pager   If 7PM-7AM, please contact night-coverage

## 2020-09-01 NOTE — Progress Notes (Addendum)
Assessment stable, VSS.  Patient denies pain/nausea after eating breakfast soft diet and lunch regular diet.  MD notified, received D/C orders. AVS reviewed with patient including directions, f/u appointments (if applicable) and medications.  Patient denies concerns or questions at this time, verbalized understanding of instructions. Volunteer services contacted to escort patient in wheelchair to entrance, patient states she has ride downstairs.  Update: patient refuses wheelchair elects to walk downstairs, RN escorted patient to elevator. Gait steady.

## 2020-09-02 DIAGNOSIS — Z87891 Personal history of nicotine dependence: Secondary | ICD-10-CM | POA: Diagnosis not present

## 2020-09-02 DIAGNOSIS — R8290 Unspecified abnormal findings in urine: Secondary | ICD-10-CM | POA: Diagnosis not present

## 2020-09-02 DIAGNOSIS — N261 Atrophy of kidney (terminal): Secondary | ICD-10-CM | POA: Diagnosis not present

## 2020-09-02 DIAGNOSIS — I10 Essential (primary) hypertension: Secondary | ICD-10-CM | POA: Diagnosis not present

## 2020-10-13 ENCOUNTER — Encounter: Payer: Self-pay | Admitting: *Deleted

## 2020-10-13 ENCOUNTER — Ambulatory Visit: Payer: BC Managed Care – PPO | Admitting: Gastroenterology

## 2020-10-14 ENCOUNTER — Encounter: Payer: Self-pay | Admitting: Gastroenterology

## 2021-09-10 ENCOUNTER — Telehealth: Payer: Self-pay | Admitting: Family Medicine

## 2021-09-10 DIAGNOSIS — R1084 Generalized abdominal pain: Secondary | ICD-10-CM

## 2021-09-10 DIAGNOSIS — K279 Peptic ulcer, site unspecified, unspecified as acute or chronic, without hemorrhage or perforation: Secondary | ICD-10-CM

## 2021-09-10 DIAGNOSIS — I10 Essential (primary) hypertension: Secondary | ICD-10-CM

## 2021-09-10 MED ORDER — LISINOPRIL 20 MG PO TABS: 10.0000 mg | ORAL_TABLET | Freq: Every day | ORAL | 0 refills | Status: DC

## 2021-09-10 MED ORDER — METOPROLOL TARTRATE 25 MG PO TABS: 25.0000 mg | ORAL_TABLET | Freq: Every day | ORAL | 0 refills | Status: DC

## 2021-09-10 NOTE — Telephone Encounter (Signed)
Pt called back and scheduled an appointment with Dr. Raliegh Ip for 09/16/2021. Pt is requesting courtesy refill until her appointment she is completely out of medications. ?

## 2021-09-10 NOTE — Telephone Encounter (Signed)
Requested medication (s) are due for refill today- no ? ?Requested medication (s) are on the active medication list -yes ? ?Future visit scheduled -no ? ?Last refill: Lisinopril 08/30/19 #90 1RF ?                Metoprolol 08/26/20 #180 ? ?Notes to clinic: Attempted to contact patient to schedule needed appointment- no answer- unable to leave message. Sent for office review due to expired Rx status ? ?Requested Prescriptions  ?Pending Prescriptions Disp Refills  ? lisinopril (ZESTRIL) 20 MG tablet [Pharmacy Med Name: LISINOPRIL 20 MG TAB] 90 tablet 1  ?  Sig: TAKE 1 TABLET BY MOUTH ONCE DAILY  ?  ? Cardiovascular:  ACE Inhibitors Failed - 09/10/2021 10:10 AM  ?  ?  Failed - Cr in normal range and within 180 days  ?  Creat  ?Date Value Ref Range Status  ?05/11/2019 1.01 0.50 - 1.05 mg/dL Final  ?  Comment:  ?  For patients >94 years of age, the reference limit ?for Creatinine is approximately 13% higher for people ?identified as African-American. ?. ?  ? ?Creatinine, Ser  ?Date Value Ref Range Status  ?08/31/2020 0.89 0.44 - 1.00 mg/dL Final  ?  ?  ?  ?  Failed - K in normal range and within 180 days  ?  Potassium  ?Date Value Ref Range Status  ?08/31/2020 4.1 3.5 - 5.1 mmol/L Final  ?03/05/2014 3.4 (L) 3.5 - 5.1 mmol/L Final  ?  ?  ?  ?  Failed - Valid encounter within last 6 months  ?  Recent Outpatient Visits   ? ?      ? 1 year ago PUD (peptic ulcer disease)  ? [place], DO  ? 1 year ago Enteritis  ? [place], DO  ? 2 years ago Annual physical exam  ? [facility] Olin Hauser, DO  ? 2 years ago Essential hypertension  ? [place], DO  ? ?  ?  ? ? ?  ?  ?  Passed - Patient is not pregnant  ?  ?  Passed - Last BP in normal range  ?  BP Readings from Last 1 Encounters:  ?09/01/20 134/76  ?  ?  ?  ?  ? metoprolol tartrate (LOPRESSOR) 25 MG tablet [Pharmacy Med Name:  METOPROLOL TARTRATE 25 MG TAB] 180 tablet 0  ?  Sig: TAKE 1 TABLET BY MOUTH TWICE DAILY  ?  ? Cardiovascular:  Beta Blockers Failed - 09/10/2021 10:10 AM  ?  ?  Failed - Valid encounter within last 6 months  ?  Recent Outpatient Visits   ? ?      ? 1 year ago PUD (peptic ulcer disease)  ? [place], DO  ? 1 year ago Enteritis  ? [place], DO  ? 2 years ago Annual physical exam  ? [facility] Olin Hauser, DO  ? 2 years ago Essential hypertension  ? [place], DO  ? ?  ?  ? ? ?  ?  ?  Passed - Last BP in normal range  ?  BP Readings from Last 1 Encounters:  ?09/01/20 134/76  ?  ?  ?  ?  Passed - Last Heart Rate in normal range  ?  Pulse Readings from Last 1 Encounters:  ?09/01/20 70  ?  ?  ?  ?  ? ? ? ?Requested Prescriptions  ?Pending Prescriptions Disp Refills  ? lisinopril (ZESTRIL) 20 MG tablet [Pharmacy Med Name: LISINOPRIL 20 MG TAB] 90 tablet 1  ?  Sig: TAKE 1 TABLET BY MOUTH ONCE DAILY  ?  ? Cardiovascular:  ACE Inhibitors Failed - 09/10/2021 10:10 AM  ?  ?  Failed - Cr in normal range and within 180 days  ?  Creat  ?Date Value Ref Range Status  ?05/11/2019 1.01 0.50 - 1.05 mg/dL Final  ?  Comment:  ?  For patients >34 years of age, the reference limit ?for Creatinine is approximately 13% higher for people ?identified as African-American. ?. ?  ? ?Creatinine, Ser  ?Date Value Ref Range Status  ?08/31/2020 0.89 0.44 - 1.00 mg/dL Final  ?  ?  ?  ?  Failed - K in normal range and within 180 days  ?  Potassium  ?Date Value Ref Range Status  ?08/31/2020 4.1 3.5 - 5.1 mmol/L Final  ?03/05/2014 3.4 (L) 3.5 - 5.1 mmol/L Final  ?  ?  ?  ?  Failed - Valid encounter within last 6 months  ?  Recent Outpatient Visits   ? ?      ? 1 year ago PUD (peptic ulcer disease)  ? [place], DO  ? 1 year ago Enteritis  ? [place], DO  ? 2 years ago Annual physical exam  ? [facility] Olin Hauser, DO  ? 2 years ago Essential hypertension  ? [place], DO  ? ?  ?  ? ? ?  ?  ?  Passed - Patient is not pregnant  ?  ?  Passed - Last BP in normal range  ?  BP Readings from Last 1 Encounters:  ?09/01/20 134/76  ?  ?  ?  ?  ? metoprolol tartrate (LOPRESSOR) 25 MG tablet [Pharmacy Med Name: METOPROLOL TARTRATE 25 MG TAB] 180 tablet 0  ?  Sig: TAKE 1 TABLET BY MOUTH TWICE DAILY  ?  ? Cardiovascular:  Beta Blockers Failed - 09/10/2021 10:10 AM  ?  ?  Failed - Valid encounter within last 6 months  ?  Recent Outpatient Visits   ? ?      ? 1 year ago PUD (peptic ulcer disease)  ? [place], DO  ? 1 year ago Enteritis  ? [place], DO  ? 2 years ago Annual physical exam  ? [facility] Olin Hauser, DO  ? 2 years ago Essential hypertension  ? [place], DO  ? ?  ?  ? ? ?  ?  ?  Passed - Last BP in normal range  ?  BP Readings from Last 1 Encounters:  ?09/01/20 134/76  ?  ?  ?  ?  Passed - Last Heart Rate in normal range  ?  Pulse Readings from Last 1 Encounters:  ?09/01/20 70  ?  ?  ?  ?  ? ? ? ?

## 2021-09-10 NOTE — Telephone Encounter (Signed)
Medications refilled

## 2021-09-10 NOTE — Addendum Note (Signed)
Addended by: Jearld Fenton on: 09/10/2021 03:55 PM ? ? Modules accepted: Orders ? ?

## 2021-09-16 ENCOUNTER — Ambulatory Visit: Payer: BC Managed Care – PPO | Admitting: Family Medicine

## 2021-09-16 ENCOUNTER — Encounter: Payer: Self-pay | Admitting: Family Medicine

## 2021-09-16 VITALS — BP 138/84 | HR 79 | Ht 66.0 in | Wt 138.2 lb

## 2021-09-16 DIAGNOSIS — I10 Essential (primary) hypertension: Secondary | ICD-10-CM | POA: Diagnosis not present

## 2021-09-16 DIAGNOSIS — F411 Generalized anxiety disorder: Secondary | ICD-10-CM | POA: Diagnosis not present

## 2021-09-16 MED ORDER — LISINOPRIL 5 MG PO TABS: 5.0000 mg | ORAL_TABLET | Freq: Every day | ORAL | 3 refills | Status: DC

## 2021-09-16 MED ORDER — ESCITALOPRAM OXALATE 10 MG PO TABS: 10.0000 mg | ORAL_TABLET | Freq: Every day | ORAL | 1 refills | Status: DC

## 2021-09-16 MED ORDER — METOPROLOL SUCCINATE ER 25 MG PO TB24: 25.0000 mg | ORAL_TABLET | Freq: Every day | ORAL | 3 refills | Status: DC

## 2021-09-16 NOTE — Patient Instructions (Addendum)
Thank you for coming to the office today. ? ?New lower dose Lisinopril '5mg'$  and new 24 hr version of metoprolol ? ?As discussed, it sounds like your symptoms are primarily related to anxiety / adjustment disorder. This is a very common problem and be related to several factors, including life stressors. ?Start treatment with Escitalopram, take 10 mg daily for next weeks. As discussed most anxiety medications are also used for mood disorders such as depression, because they work on similar chemicals in your brain. It may take up to 3-4 weeks for the medicine to take full effect and for you to notice a difference, sometimes you may notice it working sooner, otherwise we may need to adjust the dose. ? ?Future consider therapist if needed. ? ?Also - consider restless leg as an issue at night, we can consider RLS treatment that can you help. ? ?These offices have both PSYCHIATRY doctors and THERAPISTS ? ?[facility] (Virtual Available) ?[address] ?[place], [place] [address] ?Phone: [phone] ? ?[place] ?Address: [address] [place], [place] [address] ?[email or web address] ?Phone: [phone] ? ?[place] ([place] [facility]) ?Address: [address], [place], [place] [address] ?Hours: 8:30AM-5PM ?Phone: [phone] ? ?[facility] ?[address] ?[place],  [place]  [address] ?Phone: [phone] ?Fax: [phone] ? ?[facility] ?[address] ?[place], [place] [address] ?Phone: [phone] ? ?[facility] (All ages) ?[address] ?[place], [id number] ?Phone: [phone] (Option 1) ?[email or web address] ? ?----------------------------------------------------------------- ?THERAPIST ONLY  (No Psychiatry) ? ?[facility] ?[address] ?[place], [place] ?[address] [place] ?P: [phone] ? ?Buena Irish, LCSW ?[address] ?[place], [place] ?[place]: [phone] ? ?[place], [place], [place] [address] ?Hours: Open today ? 9AM-7PM ?Phone: [phone] ? ?[address], [place] ?Address: [place], [place], [place] [address] ?Phone: [phone] ? ? ? ? ?Please schedule a Follow-up Appointment to: Return in about 3 months (around 12/16/2021) for 3 month Annual Physical w fasting lab AFTER, updates anxiety. ? ?If you have any other questions or concerns, please feel free to call the office or send a message through [place]. You may also schedule an earlier appointment if necessary. ? ?Additionally, you may be receiving a survey about your experience at our office within a few days to 1 week by e-mail or mail. We value your feedback. ? ?Nobie Putnam, DO ?[place] ?

## 2021-09-16 NOTE — Progress Notes (Signed)
? ?Subjective:  ? ? Patient ID: Tina Chang, female    DOB: [birth year], 56 y.o.   MRN: [id number] ? ?Tina Chang is a 56 y.o. female presenting on 09/16/2021 for Hypertension ? ? ?HPI ? ?Anxiety, generalized ?Reports significant job stressors and other factors worsening her anxiety over past 3+ months, and now feels more irritable and agitation. Not endorsing depression. Admits little things "set her off" more than usual. She cannot tolerate sleep aid OTC, since they make her more jittery and nerves jump. She is able to fall asleep well, and does get enough sleep. But often feels mind was anxious and busy overnight and feels stressed or not always rested. Admits some restless leg as well. ? ?CHRONIC HTN: ?Reports out of some meds today. Needs re order. ?Current Meds - Metoprolol Tartrate '25mg'$  daily (not twice a day), Lisinopril '5mg'$  daily (quarter of '20mg'$    ?Reports good compliance, did not take meds today. Tolerating well, w/o complaints. ?Denies CP, dyspnea, HA, edema, dizziness / lightheadedness' ? ? ?GERD ?Hx PUD ?Improved. ?Off medications ?  ? ? ?  02/05/2019  ? 10:25 AM  ?Depression screen PHQ 2/9  ?Decreased Interest 0  ?Down, Depressed, Hopeless 0  ?PHQ - 2 Score 0  ? ? ?   ? View : No data to display.  ?  ?  ?  ? ? ? ? ?Social History  ? ?Tobacco Use  ? Smoking status: Every Day  ?  Packs/day: 0.50  ?  Years: 20.00  ?  Pack years: 10.00  ?  Types: Cigarettes  ? Smokeless tobacco: Never  ?Vaping Use  ? Vaping Use: Never used  ?Substance Use Topics  ? Alcohol use: Yes  ?  Alcohol/week: 10.0 standard drinks  ?  Types: 3 Standard drinks or equivalent, 7 Glasses of wine per week  ? Drug use: No  ? ? ?Review of Systems ?Per HPI unless specifically indicated above ? ?   ?Objective:  ?  ?BP 140/86   Pulse 79   Ht '5\' 6"'$  (1.676 m)   Wt 138 lb 3.2 oz (62.7 kg)   SpO2 100%   BMI 22.31 kg/m?   ?Wt Readings from Last 3 Encounters:  ?09/16/21 138 lb 3.2 oz (62.7 kg)  ?08/30/20 133 lb 2.5 oz (60.4 kg)  ?08/27/20  135 lb 6.4 oz (61.4 kg)  ?  ?Physical Exam ?Vitals and nursing note reviewed.  ?Constitutional:   ?   General: She is not in acute distress. ?   Appearance: Normal appearance. She is well-developed. She is not diaphoretic.  ?   Comments: Well-appearing, comfortable, cooperative  ?HENT:  ?   Head: Normocephalic and atraumatic.  ?Eyes:  ?   General:     ?   Right eye: No discharge.     ?   Left eye: No discharge.  ?   Conjunctiva/sclera: Conjunctivae normal.  ?Cardiovascular:  ?   Rate and Rhythm: Normal rate.  ?Pulmonary:  ?   Effort: Pulmonary effort is normal.  ?Skin: ?   General: Skin is warm and dry.  ?   Findings: No erythema or rash.  ?Neurological:  ?   Mental Status: She is alert and oriented to person, place, and time.  ?Psychiatric:     ?   Mood and Affect: Mood normal.     ?   Behavior: Behavior normal.     ?   Thought Content: Thought content normal.  ?   Comments: Well groomed, good  eye contact, normal speech and thoughts  ? ? ? ? ?Results for orders placed or performed during the hospital encounter of 08/30/20  ?SARS CORONAVIRUS 2 (TAT 6-24 HRS) Nasopharyngeal Nasopharyngeal Swab  ? Specimen: Nasopharyngeal Swab  ?Result Value Ref Range  ? SARS Coronavirus 2 NEGATIVE NEGATIVE  ?C Difficile Quick Screen w PCR reflex  ? Specimen: STOOL  ?Result Value Ref Range  ? C Diff antigen NEGATIVE NEGATIVE  ? C Diff toxin NEGATIVE NEGATIVE  ? C Diff interpretation No C. difficile detected.   ?Gastrointestinal Panel by PCR , Stool  ? Specimen: Stool  ?Result Value Ref Range  ? Campylobacter species NOT DETECTED NOT DETECTED  ? Plesimonas shigelloides NOT DETECTED NOT DETECTED  ? Salmonella species NOT DETECTED NOT DETECTED  ? Yersinia enterocolitica NOT DETECTED NOT DETECTED  ? Vibrio species NOT DETECTED NOT DETECTED  ? Vibrio cholerae NOT DETECTED NOT DETECTED  ? Enteroaggregative E coli (EAEC) NOT DETECTED NOT DETECTED  ? Enteropathogenic E coli (EPEC) NOT DETECTED NOT DETECTED  ? Enterotoxigenic E coli (ETEC) NOT  DETECTED NOT DETECTED  ? Shiga like toxin producing E coli (STEC) NOT DETECTED NOT DETECTED  ? Shigella/Enteroinvasive E coli (EIEC) NOT DETECTED NOT DETECTED  ? Cryptosporidium NOT DETECTED NOT DETECTED  ? Cyclospora cayetanensis NOT DETECTED NOT DETECTED  ? Entamoeba histolytica NOT DETECTED NOT DETECTED  ? Giardia lamblia NOT DETECTED NOT DETECTED  ? Adenovirus F40/41 NOT DETECTED NOT DETECTED  ? Astrovirus NOT DETECTED NOT DETECTED  ? Norovirus GI/GII NOT DETECTED NOT DETECTED  ? Rotavirus A NOT DETECTED NOT DETECTED  ? Sapovirus (I, II, IV, and V) NOT DETECTED NOT DETECTED  ?Lipase, blood  ?Result Value Ref Range  ? Lipase 34 11 - 51 U/L  ?Comprehensive metabolic panel  ?Result Value Ref Range  ? Sodium 133 (L) 135 - 145 mmol/L  ? Potassium 4.5 3.5 - 5.1 mmol/L  ? Chloride 101 98 - 111 mmol/L  ? CO2 22 22 - 32 mmol/L  ? Glucose, Bld 120 (H) 70 - 99 mg/dL  ? BUN 25 (H) 6 - 20 mg/dL  ? Creatinine, Ser 1.02 (H) 0.44 - 1.00 mg/dL  ? Calcium 9.8 8.9 - 10.3 mg/dL  ? Total Protein 8.2 (H) 6.5 - 8.1 g/dL  ? Albumin 4.6 3.5 - 5.0 g/dL  ? AST 25 15 - 41 U/L  ? ALT 17 0 - 44 U/L  ? Alkaline Phosphatase 48 38 - 126 U/L  ? Total Bilirubin 0.6 0.3 - 1.2 mg/dL  ? GFR, Estimated >60 >60 mL/min  ? Anion gap 10 5 - 15  ?CBC  ?Result Value Ref Range  ? WBC 19.0 (H) 4.0 - 10.5 K/uL  ? RBC 4.20 3.87 - 5.11 MIL/uL  ? Hemoglobin 13.8 12.0 - 15.0 g/dL  ? HCT 40.7 36.0 - 46.0 %  ? MCV 96.9 80.0 - 100.0 fL  ? MCH 32.9 26.0 - 34.0 pg  ? MCHC 33.9 30.0 - 36.0 g/dL  ? RDW 13.1 11.5 - 15.5 %  ? Platelets 308 150 - 400 K/uL  ? nRBC 0.0 0.0 - 0.2 %  ?Urinalysis, Complete w Microscopic  ?Result Value Ref Range  ? Color, Urine YELLOW (A) YELLOW  ? APPearance CLEAR (A) CLEAR  ? Specific Gravity, Urine 1.025 1.005 - 1.030  ? pH 5.0 5.0 - 8.0  ? Glucose, UA NEGATIVE NEGATIVE mg/dL  ? Hgb urine dipstick SMALL (A) NEGATIVE  ? Bilirubin Urine NEGATIVE NEGATIVE  ? Ketones, ur 5 (A) NEGATIVE  mg/dL  ? Protein, ur NEGATIVE NEGATIVE mg/dL  ? Nitrite  NEGATIVE NEGATIVE  ? Leukocytes,Ua NEGATIVE NEGATIVE  ? RBC / HPF 0-5 0 - 5 RBC/hpf  ? WBC, UA 0-5 0 - 5 WBC/hpf  ? Bacteria, UA NONE SEEN NONE SEEN  ? Squamous Epithelial / LPF 0-5 0 - 5  ?Lactic acid, plasma  ?Result Value Ref Range  ? Lactic Acid, Venous 0.7 0.5 - 1.9 mmol/L  ?Lactic acid, plasma  ?Result Value Ref Range  ? Lactic Acid, Venous 0.7 0.5 - 1.9 mmol/L  ?Basic metabolic panel  ?Result Value Ref Range  ? Sodium 137 135 - 145 mmol/L  ? Potassium 4.1 3.5 - 5.1 mmol/L  ? Chloride 108 98 - 111 mmol/L  ? CO2 23 22 - 32 mmol/L  ? Glucose, Bld 99 70 - 99 mg/dL  ? BUN 18 6 - 20 mg/dL  ? Creatinine, Ser 0.89 0.44 - 1.00 mg/dL  ? Calcium 8.8 (L) 8.9 - 10.3 mg/dL  ? GFR, Estimated >60 >60 mL/min  ? Anion gap 6 5 - 15  ?CBC  ?Result Value Ref Range  ? WBC 6.7 4.0 - 10.5 K/uL  ? RBC 3.62 (L) 3.87 - 5.11 MIL/uL  ? Hemoglobin 11.9 (L) 12.0 - 15.0 g/dL  ? HCT 35.5 (L) 36.0 - 46.0 %  ? MCV 98.1 80.0 - 100.0 fL  ? MCH 32.9 26.0 - 34.0 pg  ? MCHC 33.5 30.0 - 36.0 g/dL  ? RDW 13.0 11.5 - 15.5 %  ? Platelets 243 150 - 400 K/uL  ? nRBC 0.0 0.0 - 0.2 %  ?HIV Antibody (routine testing w rflx)  ?Result Value Ref Range  ? HIV Screen 4th Generation wRfx Non Reactive Non Reactive  ? ?   ?Assessment & Plan:  ? ?Problem List Items Addressed This Visit   ? ? Essential hypertension  ? Relevant Medications  ? lisinopril (ZESTRIL) 5 MG tablet  ? metoprolol succinate (TOPROL-XL) 25 MG 24 hr tablet  ? ?Other Visit Diagnoses   ? ? GAD (generalized anxiety disorder)    -  Primary  ? Relevant Medications  ? escitalopram (LEXAPRO) 10 MG tablet  ? ?  ?  ?HTN ?Correct dosages on medications, new rx Lisinopril '5mg'$  daily (instead of cut '20mg'$  into quarters) ?Switch Metoprolol to XL 24 hr dose '25mg'$  daily instead of tartrate '25mg'$  daily ? ?GAD ?Suspected new dx GAD now with gradual worsening causing more difficulty functioning, previously coped well, now affecting physically with fatigue from poor sleep / worrying, work/financial stress is primary  stressor. Suspected insomnia is secondary to anxiety/mood. ?See scores ?No prior medications ? ?Plan: ?1. Discussion on new diagnosis anxiety, management, complications, likely contributing to insomnia ?2. Start

## 2021-12-15 IMAGING — CT CT ABD-PELV W/ CM
1 of 3 series · 12 of 32 positions shown, 17 images · IV contrast (APPLIED)
Comparison: CT 06/11/2011 and lumbar spine 02/18/2015

CLINICAL DATA: 54-year-old with periumbilical abdominal pain.
Evaluate for appendicitis.

EXAM:
CT ABDOMEN AND PELVIS WITH CONTRAST
TECHNIQUE: Multidetector CT imaging of the abdomen and pelvis was performed
using the standard protocol following bolus administration of
intravenous contrast.
CONTRAST:  100mL OMNIPAQUE IOHEXOL 300 MG/ML  SOLN

[Series 2: axial st · axial · 0.78mm/px · z∈[-1173,-818]mm · 12 of 81 slices shown, 17 images]
[im 5/81  soft-tissue]
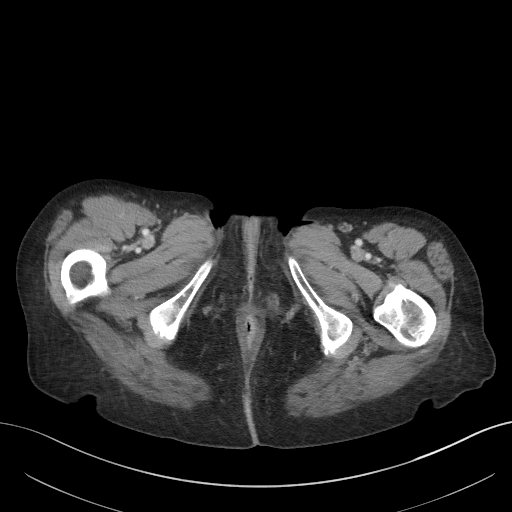
[im 5/81  bone]
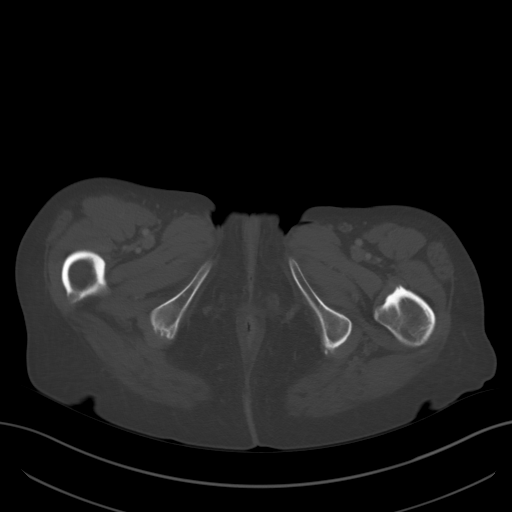
[im 15/81  soft-tissue]
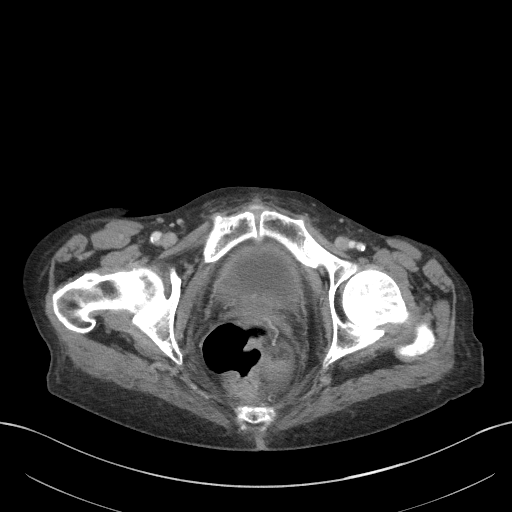
[im 19/81  soft-tissue]
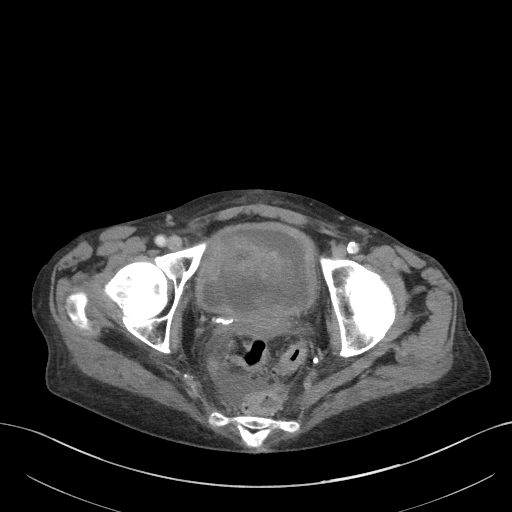
[im 29/81  soft-tissue]
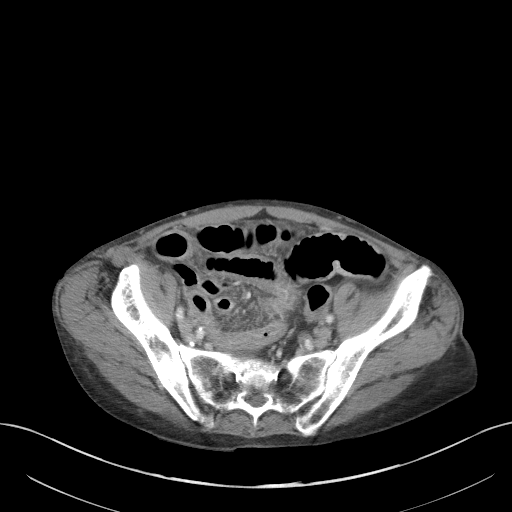
[im 33/81  soft-tissue]
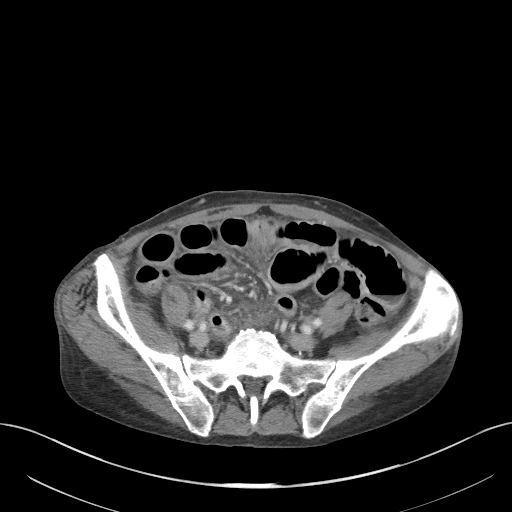
[im 43/81  soft-tissue]
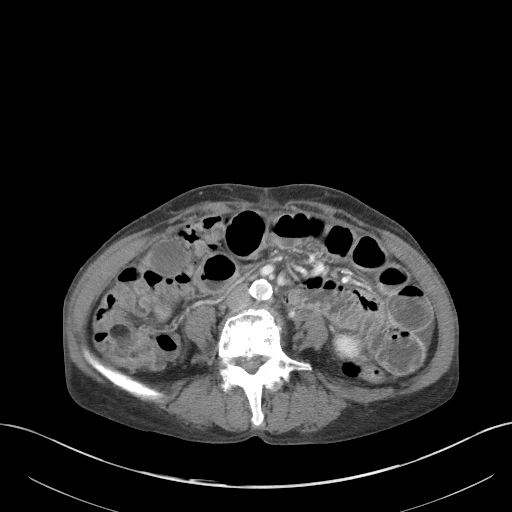
[im 48/81  soft-tissue]
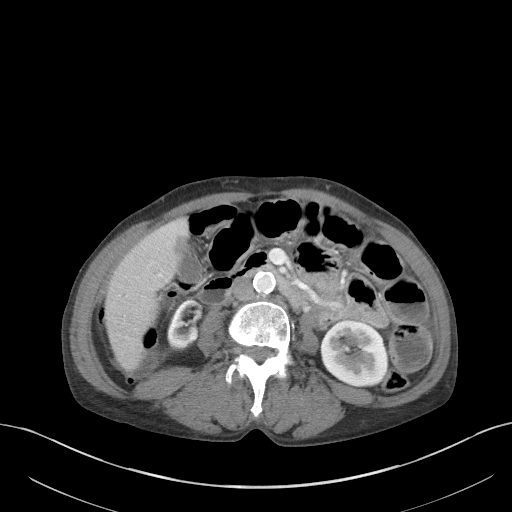
[im 52/81  soft-tissue]
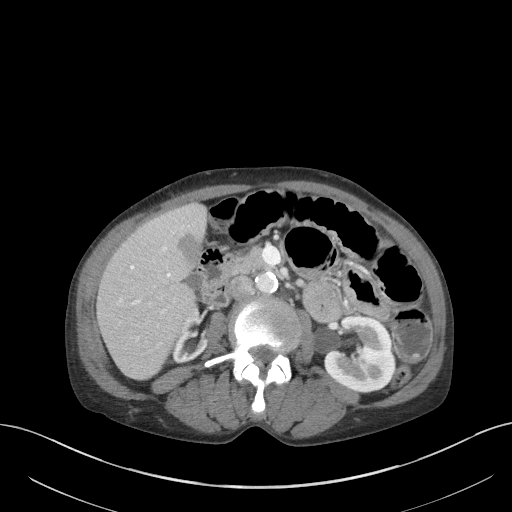
[im 62/81  soft-tissue]
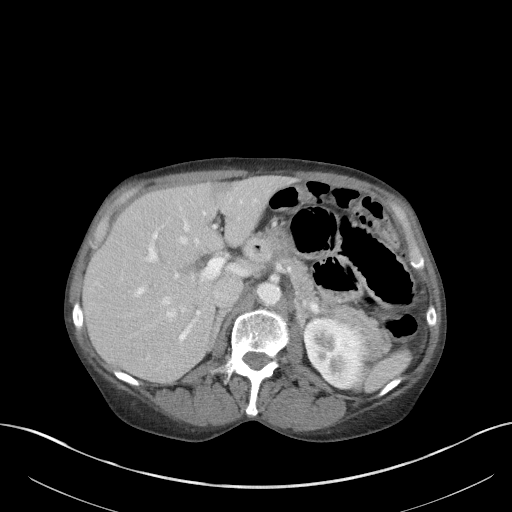
[im 62/81  lung]
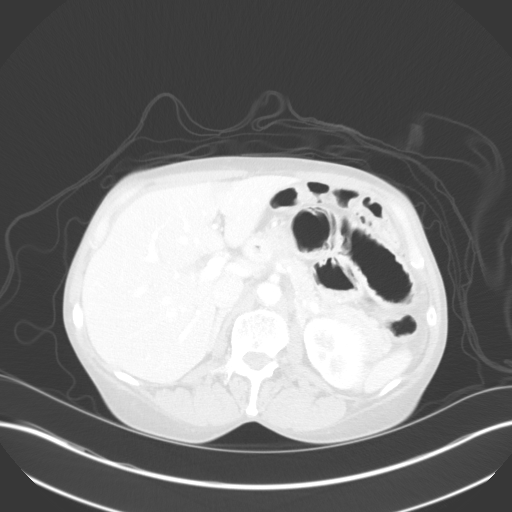
[im 62/81  bone]
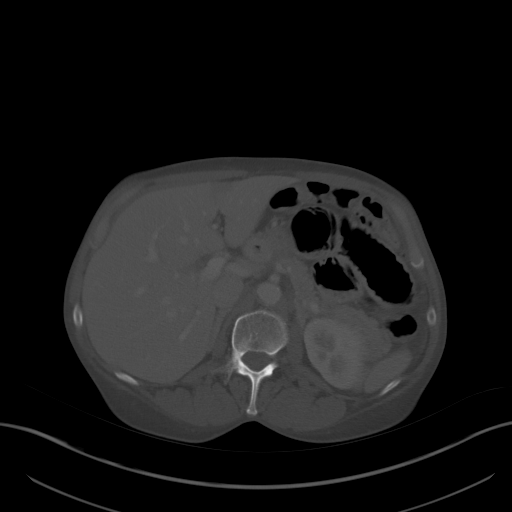
[im 66/81  soft-tissue]
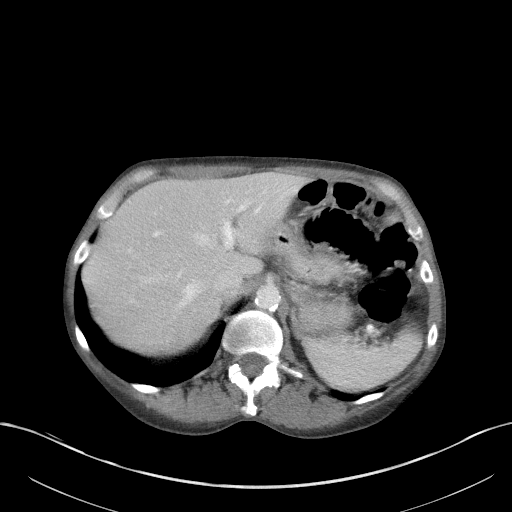
[im 66/81  lung]
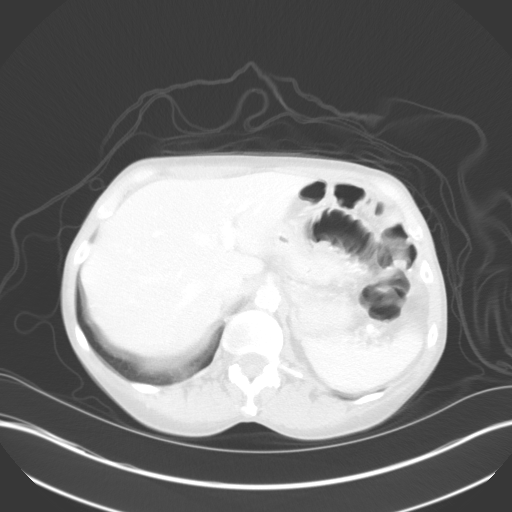
[im 71/81  lung]
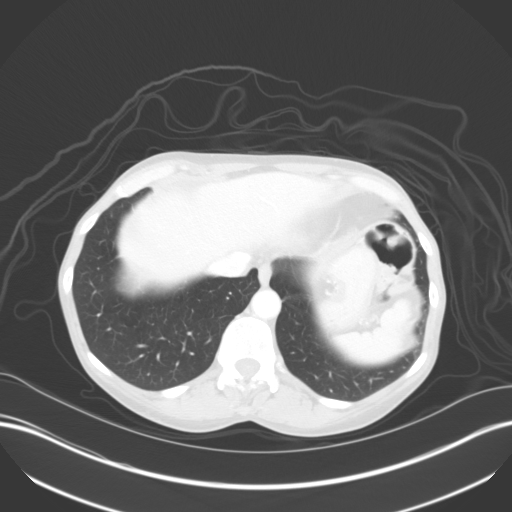
[im 76/81  soft-tissue]
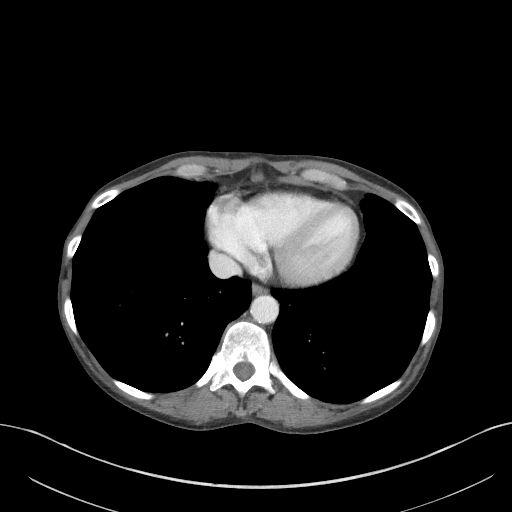
[im 76/81  lung]
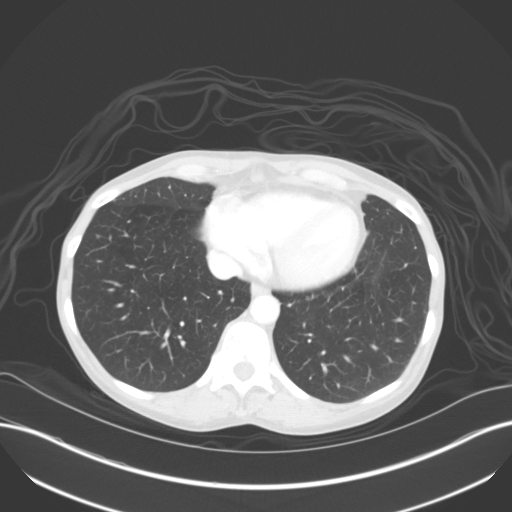

[12 of 32 positions shown; findings below may reference images not displayed]

FINDINGS: Lower chest: Lung bases are clear.

Hepatobiliary: Again noted is a low-density area in the liver near
the gallbladder. There was a similar finding on the exam from 0075
and suspect this is a benign finding such as focal fat. Again noted
is focal fat near the falciform ligament. No suspicious hepatic
lesion. Main portal venous system is patent. Normal appearance of
the gallbladder. Common bile duct is dilated measuring close to 9
mm. Difficult to evaluate the common bile duct on the previous non
contrast examination. Mild dilatation of the central intrahepatic
bile ducts.

Pancreas: Unremarkable. No pancreatic ductal dilatation or
surrounding inflammatory changes.

Spleen: Normal in size without focal abnormality.

Adrenals/Urinary Tract: Normal appearance of the adrenal glands.
Right kidney is severely atrophic and this represents a change from
0075. Fullness of the left renal pelvis and left renal calices.
Diffuse wall thickening in the urinary bladder.

Stomach/Bowel: Normal retrocecal appendix that extends into the
upper abdomen near the liver. No evidence for acute inflammation.
Multiple dilated loops of small bowel. Small bowel measures up to
3.6 cm in the left abdomen on sequence 2 image 42. Mild bowel wall
thickening and mesenteric edema in the central pelvis on sequence 2,
image 56. Small bowel wall thickening is best seen on the sagittal
images, sequence 5 image 58. No significant abnormality involving
the colon.

Vascular/Lymphatic: Extensive atherosclerotic calcifications in the
abdominal aorta without aneurysm or dissection. Celiac trunk and SMA
are patent. IMA appears to be patent. Atherosclerotic disease
involving the renal arteries. Concern for high-grade stenosis in the
right renal artery based on the right renal atrophy. Circumaortic
left renal vein.

Reproductive: Status post hysterectomy. No adnexal masses.

Other: Small amount ascites in the pelvis. Mesenteric edema
particularly in the lower abdomen. Negative for free air.

Musculoskeletal: Grade 2 anterolisthesis at L4-L5 with loss of disc
space at L4-L5. Approximately 10 cm anterolisthesis at L4-L5.
Sclerosis and endplate changes at L4-L5. Mild anterolisthesis at
L3-L4. Anterolisthesis in lumbar spine appears to be related to
severe facet arthropathy.
IMPRESSION: 1. Dilated loops of small bowel with evidence of mesenteric edema
and small bowel wall thickening in the pelvic region. Findings are
concerning for enteritis and there could be a small bowel
obstruction or ileus. Small amount of ascites.
2. Normal appearance of the appendix.
3. Diffuse wall thickening in the urinary bladder is concerning for
cystitis.
4. Mild dilatation of the biliary system. If there is clinical
concern for biliary obstruction consider further characterization
with MRCP.
5. Chronic low-density areas in the liver most likely represent
focal fat.
6. Right kidney is atrophic and this is new since 0075. Patient has
extensive atherosclerotic disease and suspect hemodynamically
significant right renal artery stenosis.
7. Fullness of the left renal collecting system without significant
hydronephrosis.
8. Severe degenerative facet disease in lumbar spine with grade 2
anterolisthesis at L4-L5.

These results were called by telephone at the time of interpretation
on 08/12/2020 at [DATE] to provider MARIEMAR UMALI , who verbally
acknowledged these results.

## 2021-12-30 ENCOUNTER — Encounter: Payer: BC Managed Care – PPO | Admitting: Family Medicine

## 2022-09-20 ENCOUNTER — Other Ambulatory Visit: Payer: Self-pay | Admitting: Family Medicine

## 2022-09-20 DIAGNOSIS — I10 Essential (primary) hypertension: Secondary | ICD-10-CM

## 2022-09-21 NOTE — Telephone Encounter (Signed)
Requested medication (s) are due for refill today: Yes  Requested medication (s) are on the active medication list: Yes  Last refill:  09/16/21  Future visit scheduled: No - Unable to reach pt. To make appointment.  Notes to clinic:  Pt. Needs appointment.    Requested Prescriptions  Pending Prescriptions Disp Refills   metoprolol succinate (TOPROL-XL) 25 MG 24 hr tablet [Pharmacy Med Name: METOPROLOL SUCCINATE ER 25 MG TAB] 90 tablet 3    Sig: TAKE 1 TABLET BY MOUTH ONCE DAILY     Cardiovascular:  Beta Blockers Failed - 09/20/2022  1:32 PM      Failed - Valid encounter within last 6 months    Recent Outpatient Visits           1 year ago GAD (generalized anxiety disorder)   [place] [facility] [place], Netta Neat, DO   2 years ago PUD (peptic ulcer disease)   [place] [facility] [place], Netta Neat, DO   2 years ago Enteritis   [place] [facility] Smitty Cords, DO   3 years ago Annual physical exam   [place] [facility] Smitty Cords, DO   3 years ago Essential hypertension   [place] [facility] Smitty Cords, DO              Passed - Last BP in normal range    BP Readings from Last 1 Encounters:  09/16/21 138/84         Passed - Last Heart Rate in normal range    Pulse Readings from Last 1 Encounters:  09/16/21 79          lisinopril (ZESTRIL) 5 MG tablet [Pharmacy Med Name: LISINOPRIL 5 MG TAB] 90 tablet 3    Sig: TAKE 1 TABLET BY MOUTH ONCE DAILY     Cardiovascular:  ACE Inhibitors Failed - 09/20/2022  1:32 PM      Failed - Cr in normal range and within 180 days    Creat  Date Value Ref Range Status  05/11/2019 1.01 0.50 - 1.05 mg/dL Final    Comment:    For patients >68 years of age, the reference limit for Creatinine is approximately 13% higher for people identified as African-American. .    Creatinine, Ser   Date Value Ref Range Status  08/31/2020 0.89 0.44 - 1.00 mg/dL Final         Failed - K in normal range and within 180 days    Potassium  Date Value Ref Range Status  08/31/2020 4.1 3.5 - 5.1 mmol/L Final  03/05/2014 3.4 (L) 3.5 - 5.1 mmol/L Final         Failed - Valid encounter within last 6 months    Recent Outpatient Visits           1 year ago GAD (generalized anxiety disorder)   [place] [facility] Smitty Cords, DO   2 years ago PUD (peptic ulcer disease)   [place] [facility] [place], Netta Neat, DO   2 years ago Enteritis   [place] [facility] Smitty Cords, DO   3 years ago Annual physical exam   [place] [facility] Smitty Cords, DO   3 years ago Essential hypertension   [place] [facility] [place], Netta Neat, [place]  Passed - Patient is not pregnant      Passed - Last BP in normal range    BP Readings from Last 1 Encounters:  09/16/21 138/84

## 2022-09-22 ENCOUNTER — Other Ambulatory Visit: Payer: Self-pay | Admitting: Family Medicine

## 2022-09-22 DIAGNOSIS — I10 Essential (primary) hypertension: Secondary | ICD-10-CM

## 2022-09-23 NOTE — Telephone Encounter (Signed)
Requested Prescriptions  Pending Prescriptions Disp Refills   metoprolol succinate (TOPROL-XL) 25 MG 24 hr tablet [Pharmacy Med Name: METOPROLOL SUCCINATE ER 25 MG TAB] 30 tablet 0    Sig: TAKE 1 TABLET BY MOUTH ONCE DAILY     Cardiovascular:  Beta Blockers Failed - 09/22/2022  5:57 PM      Failed - Valid encounter within last 6 months    Recent Outpatient Visits           1 year ago GAD (generalized anxiety disorder)   [facility] [place], Tina Neat, DO   2 years ago PUD (peptic ulcer disease)   [facility] [place], Tina Neat, DO   2 years ago Enteritis   [place] [facility] Smitty Cords, DO   3 years ago Annual physical exam   [place] [facility] Smitty Cords, DO   3 years ago Essential hypertension   [facility] [place], Tina Neat, DO       Future Appointments             In 4 days Tina Chang, Tina Neat, DO [facility]            Passed - Last BP in normal range    BP Readings from Last 1 Encounters:  09/16/21 138/84         Passed - Last Heart Rate in normal range    Pulse Readings from Last 1 Encounters:  09/16/21 79          lisinopril (ZESTRIL) 5 MG tablet [Pharmacy Med Name: LISINOPRIL 5 MG TAB] 30 tablet 0    Sig: TAKE 1 TABLET BY MOUTH ONCE DAILY     Cardiovascular:  ACE Inhibitors Failed - 09/22/2022  5:57 PM      Failed - Cr in normal range and within 180 days    Creat  Date Value Ref Range Status  05/11/2019 1.01 0.50 - 1.05 mg/dL Final    Comment:    For patients >52 years of age, the reference limit for Creatinine is approximately 13% higher for people identified as African-American. .    Creatinine, Ser  Date Value Ref Range Status  08/31/2020 0.89 0.44 - 1.00 mg/dL Final         Failed - K in normal range and within 180 days    Potassium   Date Value Ref Range Status  08/31/2020 4.1 3.5 - 5.1 mmol/L Final  03/05/2014 3.4 (L) 3.5 - 5.1 mmol/L Final         Failed - Valid encounter within last 6 months    Recent Outpatient Visits           1 year ago GAD (generalized anxiety disorder)   [place] Smitty Cords, DO   2 years ago PUD (peptic ulcer disease)   [place] [facility] [place], Tina Neat, DO   2 years ago Enteritis   [place] [facility] Smitty Cords, DO   3 years ago Annual physical exam   [place] [facility] Smitty Cords, DO   3 years ago Essential hypertension    [facility] Smitty Cords, DO       Future Appointments             In 4 days Tina Chang,  Tina Neat, DO [facility], [facility]            Passed - Patient is not pregnant      Passed - Last BP in normal range    BP Readings from Last 1 Encounters:  09/16/21 138/84

## 2022-09-27 ENCOUNTER — Other Ambulatory Visit: Payer: Self-pay | Admitting: Family Medicine

## 2022-09-27 ENCOUNTER — Ambulatory Visit (INDEPENDENT_AMBULATORY_CARE_PROVIDER_SITE_OTHER): Payer: BC Managed Care – PPO | Admitting: Family Medicine

## 2022-09-27 VITALS — BP 138/72 | HR 80 | Ht 66.0 in | Wt 120.0 lb

## 2022-09-27 DIAGNOSIS — F5101 Primary insomnia: Secondary | ICD-10-CM

## 2022-09-27 DIAGNOSIS — I10 Essential (primary) hypertension: Secondary | ICD-10-CM

## 2022-09-27 DIAGNOSIS — R7309 Other abnormal glucose: Secondary | ICD-10-CM

## 2022-09-27 DIAGNOSIS — Z720 Tobacco use: Secondary | ICD-10-CM

## 2022-09-27 DIAGNOSIS — F411 Generalized anxiety disorder: Secondary | ICD-10-CM

## 2022-09-27 DIAGNOSIS — R630 Anorexia: Secondary | ICD-10-CM

## 2022-09-27 DIAGNOSIS — Z0000 Encounter for general adult medical examination without abnormal findings: Secondary | ICD-10-CM

## 2022-09-27 DIAGNOSIS — Z122 Encounter for screening for malignant neoplasm of respiratory organs: Secondary | ICD-10-CM

## 2022-09-27 MED ORDER — LISINOPRIL 5 MG PO TABS: 5.0000 mg | ORAL_TABLET | Freq: Every day | ORAL | 3 refills | Status: AC

## 2022-09-27 MED ORDER — ESCITALOPRAM OXALATE 10 MG PO TABS: 10.0000 mg | ORAL_TABLET | Freq: Every day | ORAL | 3 refills | Status: AC

## 2022-09-27 MED ORDER — METOPROLOL SUCCINATE ER 25 MG PO TB24: 25.0000 mg | ORAL_TABLET | Freq: Every day | ORAL | 3 refills | Status: AC

## 2022-09-27 MED ORDER — MIRTAZAPINE 15 MG PO TABS: 15.0000 mg | ORAL_TABLET | Freq: Every day | ORAL | 2 refills | Status: DC

## 2022-09-27 NOTE — Progress Notes (Signed)
Subjective:    Patient ID: Tina Chang, female    DOB: [birth year], 57 y.o.   MRN: [id number]  Tina Chang is a 57 y.o. female presenting on 09/27/2022 for Medical Management of Chronic Issues   HPI  Anxiety, generalized Prior history - Reports significant job stressors and other factors worsening her anxiety over past 3+ months, and now feels more irritable and agitation. Not endorsing depression.  She has done better on Escitalopram prefers to stay on med but it causes insomnia.  Difficulty sleeping at night. Laying there cannot fall asleep She is in lighter sleep Not related to breathing or pain or overactive mind Continues to take Escitalopram 10mg  daily   CHRONIC HTN: Reports out of some meds today. Needs re order. Current Meds - Metoprolol XL 25mg  daily, Lisinopril 5mg  daily Reports good compliance, did not take meds today. Tolerating well, w/o complaints. Denies CP, dyspnea, HA, edema, dizziness / lightheadedness'    GERD Hx PUD Improved. Off medications No further GI symptoms at this time. She had prior flare after therapy but has not returned.  Tobacco Abuse Poor Appetite / Wt loss Smoking 0.5ppd 20+ years. Unable to gain weight Feels not as hungry, or interested in food. She will make herself eat a meal. Constantly walking and working      09/27/2022    3:45 PM 02/05/2019   10:25 AM  Depression screen PHQ 2/9  Decreased Interest 1 0  Down, Depressed, Hopeless 1 0  PHQ - 2 Score 2 0  Altered sleeping 1   Tired, decreased energy 1   Change in appetite 1   Feeling bad or failure about yourself  0   Trouble concentrating 0   Moving slowly or fidgety/restless 0   Suicidal thoughts 0   PHQ-9 Score 5       09/27/2022    3:45 PM  GAD 7 : Generalized Anxiety Score  Nervous, Anxious, on Edge 1  Control/stop worrying 0  Worry too much - different things 1  Trouble relaxing 1  Restless 1  Easily annoyed or irritable 1  Afraid - awful might happen 1   Total GAD 7 Score 6      Social History   Tobacco Use   Smoking status: Every Day    Packs/day: 0.50    Years: 20.00    Additional pack years: 0.00    Total pack years: 10.00    Types: Cigarettes   Smokeless tobacco: Never  Vaping Use   Vaping Use: Never used  Substance Use Topics   Alcohol use: Yes    Alcohol/week: 10.0 standard drinks of alcohol    Types: 3 Standard drinks or equivalent, 7 Glasses of wine per week   Drug use: No    Review of Systems  Constitutional:  Positive for appetite change. Negative for activity change, chills, diaphoresis, fatigue and fever.  HENT:  Negative for congestion and hearing loss.   Eyes:  Negative for visual disturbance.  Respiratory:  Positive for cough. Negative for chest tightness, shortness of breath and wheezing.   Cardiovascular:  Negative for chest pain, palpitations and leg swelling.  Gastrointestinal:  Negative for abdominal pain, constipation, diarrhea, nausea and vomiting.  Genitourinary:  Negative for dysuria, frequency and hematuria.  Musculoskeletal:  Negative for arthralgias and neck pain.  Skin:  Negative for rash.  Neurological:  Negative for dizziness, weakness, light-headedness, numbness and headaches.  Hematological:  Negative for adenopathy.  Psychiatric/Behavioral:  Negative for behavioral problems, dysphoric mood  and sleep disturbance.    Per HPI unless specifically indicated above     Objective:    BP 138/72 (BP Location: Left Arm, Cuff Size: Normal)   Pulse 80   Ht 5\' 6"  (1.676 m)   Wt 120 lb (54.4 kg)   SpO2 99%   BMI 19.37 kg/m   Wt Readings from Last 3 Encounters:  09/27/22 120 lb (54.4 kg)  09/16/21 138 lb 3.2 oz (62.7 kg)  08/30/20 133 lb 2.5 oz (60.4 kg)    Physical Exam Vitals and nursing note reviewed.  Constitutional:      General: She is not in acute distress.    Appearance: She is well-developed. She is not diaphoretic.     Comments: Thin appearing comfortable, cooperative  HENT:      Head: Normocephalic and atraumatic.  Eyes:     General:        Right eye: No discharge.        Left eye: No discharge.     Conjunctiva/sclera: Conjunctivae normal.     Pupils: Pupils are equal, round, and reactive to light.  Neck:     Thyroid: No thyromegaly.  Cardiovascular:     Rate and Rhythm: Normal rate and regular rhythm.     Pulses: Normal pulses.     Heart sounds: Normal heart sounds. No murmur heard. Pulmonary:     Effort: Pulmonary effort is normal. No respiratory distress.     Breath sounds: No wheezing or rales.     Comments: Cough Mild reduced air movement diffuse Abdominal:     General: Bowel sounds are normal. There is no distension.     Palpations: Abdomen is soft. There is no mass.     Tenderness: There is no abdominal tenderness.  Musculoskeletal:        General: No tenderness. Normal range of motion.     Cervical back: Normal range of motion and neck supple.     Comments: Upper / Lower Extremities: - Normal muscle tone, strength bilateral upper extremities 5/5, lower extremities 5/5  Lymphadenopathy:     Cervical: No cervical adenopathy.  Skin:    General: Skin is warm and dry.     Findings: No erythema or rash.  Neurological:     Mental Status: She is alert and oriented to person, place, and time.     Comments: Distal sensation intact to light touch all extremities  Psychiatric:        Mood and Affect: Mood normal.        Behavior: Behavior normal.        Thought Content: Thought content normal.     Comments: Well groomed, good eye contact, normal speech and thoughts      Results for orders placed or performed during the hospital encounter of 08/30/20  SARS CORONAVIRUS 2 (TAT 6-24 HRS) Nasopharyngeal Nasopharyngeal Swab   Specimen: Nasopharyngeal Swab  Result Value Ref Range   SARS Coronavirus 2 NEGATIVE NEGATIVE  C Difficile Quick Screen w PCR reflex   Specimen: STOOL  Result Value Ref Range   C Diff antigen NEGATIVE NEGATIVE   C Diff toxin  NEGATIVE NEGATIVE   C Diff interpretation No C. difficile detected.   Gastrointestinal Panel by PCR , Stool   Specimen: Stool  Result Value Ref Range   Campylobacter species NOT DETECTED NOT DETECTED   Plesimonas shigelloides NOT DETECTED NOT DETECTED   Salmonella species NOT DETECTED NOT DETECTED   Yersinia enterocolitica NOT DETECTED NOT DETECTED   Vibrio  species NOT DETECTED NOT DETECTED   Vibrio cholerae NOT DETECTED NOT DETECTED   Enteroaggregative E coli (EAEC) NOT DETECTED NOT DETECTED   Enteropathogenic E coli (EPEC) NOT DETECTED NOT DETECTED   Enterotoxigenic E coli (ETEC) NOT DETECTED NOT DETECTED   Shiga like toxin producing E coli (STEC) NOT DETECTED NOT DETECTED   Shigella/Enteroinvasive E coli (EIEC) NOT DETECTED NOT DETECTED   Cryptosporidium NOT DETECTED NOT DETECTED   Cyclospora cayetanensis NOT DETECTED NOT DETECTED   Entamoeba histolytica NOT DETECTED NOT DETECTED   Giardia lamblia NOT DETECTED NOT DETECTED   Adenovirus F40/41 NOT DETECTED NOT DETECTED   Astrovirus NOT DETECTED NOT DETECTED   Norovirus GI/GII NOT DETECTED NOT DETECTED   Rotavirus A NOT DETECTED NOT DETECTED   Sapovirus (I, II, IV, and V) NOT DETECTED NOT DETECTED  Lipase, blood  Result Value Ref Range   Lipase 34 11 - 51 U/L  Comprehensive metabolic panel  Result Value Ref Range   Sodium 133 (L) 135 - 145 mmol/L   Potassium 4.5 3.5 - 5.1 mmol/L   Chloride 101 98 - 111 mmol/L   CO2 22 22 - 32 mmol/L   Glucose, Bld 120 (H) 70 - 99 mg/dL   BUN 25 (H) 6 - 20 mg/dL   Creatinine, Ser 4.09 (H) 0.44 - 1.00 mg/dL   Calcium 9.8 8.9 - 81.1 mg/dL   Total Protein 8.2 (H) 6.5 - 8.1 g/dL   Albumin 4.6 3.5 - 5.0 g/dL   AST 25 15 - 41 U/L   ALT 17 0 - 44 U/L   Alkaline Phosphatase 48 38 - 126 U/L   Total Bilirubin 0.6 0.3 - 1.2 mg/dL   GFR, Estimated >91 >47 mL/min   Anion gap 10 5 - 15  CBC  Result Value Ref Range   WBC 19.0 (H) 4.0 - 10.5 K/uL   RBC 4.20 3.87 - 5.11 MIL/uL   Hemoglobin 13.8 12.0  - 15.0 g/dL   HCT 82.9 56.2 - 13.0 %   MCV 96.9 80.0 - 100.0 fL   MCH 32.9 26.0 - 34.0 pg   MCHC 33.9 30.0 - 36.0 g/dL   RDW 86.5 78.4 - 69.6 %   Platelets 308 150 - 400 K/uL   nRBC 0.0 0.0 - 0.2 %  Urinalysis, Complete w Microscopic  Result Value Ref Range   Color, Urine YELLOW (A) YELLOW   APPearance CLEAR (A) CLEAR   Specific Gravity, Urine 1.025 1.005 - 1.030   pH 5.0 5.0 - 8.0   Glucose, UA NEGATIVE NEGATIVE mg/dL   Hgb urine dipstick SMALL (A) NEGATIVE   Bilirubin Urine NEGATIVE NEGATIVE   Ketones, ur 5 (A) NEGATIVE mg/dL   Protein, ur NEGATIVE NEGATIVE mg/dL   Nitrite NEGATIVE NEGATIVE   Leukocytes,Ua NEGATIVE NEGATIVE   RBC / HPF 0-5 0 - 5 RBC/hpf   WBC, UA 0-5 0 - 5 WBC/hpf   Bacteria, UA NONE SEEN NONE SEEN   Squamous Epithelial / HPF 0-5 0 - 5  Lactic acid, plasma  Result Value Ref Range   Lactic Acid, Venous 0.7 0.5 - 1.9 mmol/L  Lactic acid, plasma  Result Value Ref Range   Lactic Acid, Venous 0.7 0.5 - 1.9 mmol/L  Basic metabolic panel  Result Value Ref Range   Sodium 137 135 - 145 mmol/L   Potassium 4.1 3.5 - 5.1 mmol/L   Chloride 108 98 - 111 mmol/L   CO2 23 22 - 32 mmol/L   Glucose, Bld 99 70 - 99  mg/dL   BUN 18 6 - 20 mg/dL   Creatinine, Ser 1.61 0.44 - 1.00 mg/dL   Calcium 8.8 (L) 8.9 - 10.3 mg/dL   GFR, Estimated >09 >60 mL/min   Anion gap 6 5 - 15  CBC  Result Value Ref Range   WBC 6.7 4.0 - 10.5 K/uL   RBC 3.62 (L) 3.87 - 5.11 MIL/uL   Hemoglobin 11.9 (L) 12.0 - 15.0 g/dL   HCT 45.4 (L) 09.8 - 11.9 %   MCV 98.1 80.0 - 100.0 fL   MCH 32.9 26.0 - 34.0 pg   MCHC 33.5 30.0 - 36.0 g/dL   RDW 14.7 82.9 - 56.2 %   Platelets 243 150 - 400 K/uL   nRBC 0.0 0.0 - 0.2 %  HIV Antibody (routine testing w rflx)  Result Value Ref Range   HIV Screen 4th Generation wRfx Non Reactive Non Reactive      Assessment & Plan:   Problem List Items Addressed This Visit     Essential hypertension   Relevant Medications   lisinopril (ZESTRIL) 5 MG tablet    metoprolol succinate (TOPROL-XL) 25 MG 24 hr tablet   Tobacco abuse   Relevant Orders   Ambulatory Referral Lung Cancer Screening [facility]   Other Visit Diagnoses     GAD (generalized anxiety disorder)    -  Primary   Relevant Medications   mirtazapine (REMERON) 15 MG tablet   escitalopram (LEXAPRO) 10 MG tablet   Primary insomnia       Relevant Medications   mirtazapine (REMERON) 15 MG tablet   Screening for lung cancer       Relevant Orders   Ambulatory Referral Lung Cancer Screening [facility]   Appetite loss       Relevant Medications   mirtazapine (REMERON) 15 MG tablet      HYPERTENSION Mild elevated Continue current therapy  Tobacco Abuse Weight loss  Appetite Loss  Likely secondary to smoking. Discussed tobacco abuse and taste / appetite changes, difficulty gaining or maintaining weight Recommend smoking cessation  Referral for CT Scan of Chest - Lung Cancer Screening. Stay tuned for scheduling.  [facility] [address] [place], [place] [address] Phone: [phone]  Breztri sample inhaler 7 day - 2 puffs in morning and 2 puffs in evening. Will order Breztri if beneficial, patient can contact us back.  Anxiety Controlled on Lexapro. Seems to be causing insomnia. Keep on the Lexapro for now  Added the new Remeron (Mirtazapine) 15mg  nightly for sleep / mood/anxiety and improve appetite.  If you prefer, we can come off Lexapro, maybe at next visit if you prefer.   Orders Placed This Encounter  Procedures   Ambulatory Referral Lung Cancer Screening [facility]    Referral Priority:   Routine    Referral Type:   Consultation    Referral Reason:   Specialty Services Required    Number of Visits Requested:   1     Meds ordered this encounter  Medications   mirtazapine (REMERON) 15 MG tablet    Sig: Take 1 tablet (15 mg total) by mouth at bedtime.    Dispense:  30 tablet    Refill:  2    lisinopril (ZESTRIL) 5 MG tablet    Sig: Take 1 tablet (5 mg total) by mouth daily.    Dispense:  90 tablet    Refill:  3   metoprolol succinate (TOPROL-XL) 25 MG 24 hr tablet  Sig: Take 1 tablet (25 mg total) by mouth daily.    Dispense:  90 tablet    Refill:  3   escitalopram (LEXAPRO) 10 MG tablet    Sig: Take 1 tablet (10 mg total) by mouth daily.    Dispense:  90 tablet    Refill:  3      Follow up plan: Return in about 6 weeks (around 11/08/2022) for 6 weeks Lab review, Breathing/Cough / Insomnia/Anxiety.  Future labs 10/05/22  Saralyn Pilar, DO [facility] 09/27/2022, 3:53 PM

## 2022-09-27 NOTE — Patient Instructions (Addendum)
Thank you for coming to the office today.  Referral for CT Scan of Chest - Lung Cancer Screening. Stay tuned for scheduling.  [facility] [address] [place], [place] [address] Phone: [phone]  Breztri sample inhaler 7 day - 2 puffs in morning and 2 puffs in evening.  Keep on the Lexapro for now  Added the new Remeron (Mirtazapine) 15mg  nightly for sleep / mood/anxiety and improve appetite.  If you prefer, we can come off Lexapro, maybe at next visit if you prefer.   DUE for FASTING BLOOD WORK (no food or drink after midnight before the lab appointment, only water or coffee without cream/sugar on the morning of)  SCHEDULE "Lab Only" visit in the morning at the clinic for lab draw in 1 week  - Make sure Lab Only appointment is at about 1 week before your next appointment, so that results will be available  For Lab Results, once available within 2-3 days of blood draw, you can can log in to MyChart online to view your results and a brief explanation. Also, we can discuss results at next follow-up visit.    Please schedule a Follow-up Appointment to: Return in about 6 weeks (around 11/08/2022) for 6 weeks Lab review, Breathing/Cough / Insomnia/Anxiety.  If you have any other questions or concerns, please feel free to call the office or send a message through MyChart. You may also schedule an earlier appointment if necessary.  Additionally, you may be receiving a survey about your experience at our office within a few days to 1 week by e-mail or mail. We value your feedback.  Saralyn Pilar, DO [facility], [place]

## 2022-10-05 ENCOUNTER — Other Ambulatory Visit: Payer: BC Managed Care – PPO

## 2022-10-05 DIAGNOSIS — I10 Essential (primary) hypertension: Secondary | ICD-10-CM

## 2022-10-05 DIAGNOSIS — R7309 Other abnormal glucose: Secondary | ICD-10-CM | POA: Diagnosis not present

## 2022-10-05 DIAGNOSIS — Z0000 Encounter for general adult medical examination without abnormal findings: Secondary | ICD-10-CM | POA: Diagnosis not present

## 2022-10-06 LAB — CBC WITH DIFFERENTIAL/PLATELET
Absolute Monocytes: 655 cells/uL (ref 200–950)
Basophils Absolute: 62 cells/uL (ref 0–200)
Basophils Relative: 0.8 %
Eosinophils Absolute: 131 cells/uL (ref 15–500)
Eosinophils Relative: 1.7 %
HCT: 39.8 % (ref 35.0–45.0)
Hemoglobin: 13.7 g/dL (ref 11.7–15.5)
Lymphs Abs: 1417 cells/uL (ref 850–3900)
MCH: 34 pg — ABNORMAL HIGH (ref 27.0–33.0)
MCHC: 34.4 g/dL (ref 32.0–36.0)
MCV: 98.8 fL (ref 80.0–100.0)
MPV: 10 fL (ref 7.5–12.5)
Monocytes Relative: 8.5 %
Neutro Abs: 5436 cells/uL (ref 1500–7800)
Neutrophils Relative %: 70.6 %
Platelets: 284 10*3/uL (ref 140–400)
RBC: 4.03 10*6/uL (ref 3.80–5.10)
RDW: 11.9 % (ref 11.0–15.0)
Total Lymphocyte: 18.4 %
WBC: 7.7 10*3/uL (ref 3.8–10.8)

## 2022-10-06 LAB — COMPLETE METABOLIC PANEL WITH GFR
AG Ratio: 1.5 (calc) (ref 1.0–2.5)
ALT: 15 U/L (ref 6–29)
AST: 26 U/L (ref 10–35)
Albumin: 4.3 g/dL (ref 3.6–5.1)
Alkaline phosphatase (APISO): 51 U/L (ref 37–153)
BUN/Creatinine Ratio: 32 (calc) — ABNORMAL HIGH (ref 6–22)
BUN: 36 mg/dL — ABNORMAL HIGH (ref 7–25)
CO2: 23 mmol/L (ref 20–32)
Calcium: 9.6 mg/dL (ref 8.6–10.4)
Chloride: 105 mmol/L (ref 98–110)
Creat: 1.11 mg/dL — ABNORMAL HIGH (ref 0.50–1.03)
Globulin: 2.8 g/dL (calc) (ref 1.9–3.7)
Glucose, Bld: 97 mg/dL (ref 65–99)
Potassium: 5.5 mmol/L — ABNORMAL HIGH (ref 3.5–5.3)
Sodium: 138 mmol/L (ref 135–146)
Total Bilirubin: 0.4 mg/dL (ref 0.2–1.2)
Total Protein: 7.1 g/dL (ref 6.1–8.1)
eGFR: 58 mL/min/{1.73_m2} — ABNORMAL LOW (ref >=60)

## 2022-10-06 LAB — HEMOGLOBIN A1C
Hgb A1c MFr Bld: 5.5 % of total Hgb (ref <5.7)
Mean Plasma Glucose: 111 mg/dL
eAG (mmol/L): 6.2 mmol/L

## 2022-10-06 LAB — LIPID PANEL
Cholesterol: 181 mg/dL (ref <200)
HDL: 88 mg/dL (ref >=50)
LDL Cholesterol (Calc): 79 mg/dL (calc)
Non-HDL Cholesterol (Calc): 93 mg/dL (calc) (ref <130)
Total CHOL/HDL Ratio: 2.1 (calc) (ref <5.0)
Triglycerides: 62 mg/dL (ref <150)

## 2022-10-06 LAB — TSH: TSH: 0.85 mIU/L (ref 0.40–4.50)

## 2022-11-04 ENCOUNTER — Other Ambulatory Visit: Payer: Self-pay | Admitting: Family Medicine

## 2022-11-04 DIAGNOSIS — F5101 Primary insomnia: Secondary | ICD-10-CM

## 2022-11-04 DIAGNOSIS — R630 Anorexia: Secondary | ICD-10-CM

## 2022-11-04 DIAGNOSIS — F411 Generalized anxiety disorder: Secondary | ICD-10-CM

## 2022-11-05 NOTE — Telephone Encounter (Signed)
Last RF 09/27/22 #30 2 RF    Requested Prescriptions  Refused Prescriptions Disp Refills   mirtazapine (REMERON) 15 MG tablet [Pharmacy Med Name: MIRTAZAPINE 15 MG TAB] 30 tablet 2    Sig: TAKE 1 TABLET BY MOUTH AT BEDTIME     Psychiatry: Antidepressants - mirtazapine Passed - 11/04/2022  3:59 PM      Passed - Valid encounter within last 6 months    Recent Outpatient Visits           1 month ago GAD (generalized anxiety disorder)   [place] [facility], Netta Neat, DO   1 year ago GAD (generalized anxiety disorder)   [place] [facility] Smitty Cords, DO   2 years ago PUD (peptic ulcer disease)   [place] [facility] [place], Netta Neat, DO   2 years ago Enteritis   [place] [facility] Smitty Cords, DO   3 years ago Annual physical exam   [place] [facility] Charon, Netta Neat, DO       Future Appointments             In 3 days Al[place] Charon, Netta Neat, DO [place] [facility], [facility]

## 2022-11-08 ENCOUNTER — Ambulatory Visit: Payer: BC Managed Care – PPO | Admitting: Family Medicine

## 2023-02-18 ENCOUNTER — Other Ambulatory Visit: Payer: Self-pay | Admitting: Family Medicine

## 2023-02-18 DIAGNOSIS — R630 Anorexia: Secondary | ICD-10-CM

## 2023-02-18 DIAGNOSIS — F411 Generalized anxiety disorder: Secondary | ICD-10-CM

## 2023-02-18 DIAGNOSIS — F5101 Primary insomnia: Secondary | ICD-10-CM

## 2023-02-18 NOTE — Telephone Encounter (Signed)
Requested Prescriptions  Pending Prescriptions Disp Refills   mirtazapine (REMERON) 15 MG tablet [Pharmacy Med Name: MIRTAZAPINE 15 MG TAB] 30 tablet 2    Sig: TAKE 1 TABLET BY MOUTH AT BEDTIME     Psychiatry: Antidepressants - mirtazapine Passed - 02/18/2023  2:20 PM      Passed - Valid encounter within last 6 months    Recent Outpatient Visits           4 months ago GAD (generalized anxiety disorder)   [place] [facility] [place], Netta Neat, DO   1 year ago GAD (generalized anxiety disorder)   [facility] Smitty Cords, DO   2 years ago PUD (peptic ulcer disease)   [place] [facility] [place], Netta Neat, DO   2 years ago Enteritis   [place] [facility] Smitty Cords, DO   3 years ago Annual physical exam   [place] [facility], Netta Neat, [place]

## 2023-06-05 ENCOUNTER — Emergency Department
Admission: EM | Admit: 2023-06-05 | Discharge: 2023-06-05 | Disposition: A | Discharge status: Home / Self Care | Payer: BC Managed Care – PPO | Attending: Emergency Medicine | Admitting: Emergency Medicine

## 2023-06-05 ENCOUNTER — Emergency Department: Payer: BC Managed Care – PPO

## 2023-06-05 DIAGNOSIS — M25551 Pain in right hip: Secondary | ICD-10-CM | POA: Diagnosis not present

## 2023-06-05 DIAGNOSIS — M160 Bilateral primary osteoarthritis of hip: Secondary | ICD-10-CM | POA: Diagnosis not present

## 2023-06-05 MED ORDER — KETOROLAC TROMETHAMINE 30 MG/ML IJ SOLN
30.0000 mg | Freq: Once | INTRAMUSCULAR | Status: AC
  Administered 2023-06-05: 30 mg via INTRAMUSCULAR
  Filled 2023-06-05: qty 1

## 2023-06-05 MED ORDER — PREDNISONE 20 MG PO TABS
60.0000 mg | ORAL_TABLET | Freq: Once | ORAL | Status: AC
  Administered 2023-06-05: 60 mg via ORAL
  Filled 2023-06-05: qty 3

## 2023-06-05 MED ORDER — NAPROXEN 500 MG PO TABS: 500.0000 mg | ORAL_TABLET | Freq: Two times a day (BID) | ORAL | 2 refills | Status: AC

## 2023-06-05 MED ORDER — OXYCODONE-ACETAMINOPHEN 5-325 MG PO TABS: 1.0000 | ORAL_TABLET | Freq: Three times a day (TID) | ORAL | 0 refills | Status: AC | PRN

## 2023-06-05 MED ORDER — PREDNISONE 50 MG PO TABS: 50.0000 mg | ORAL_TABLET | Freq: Every day | ORAL | 0 refills | Status: AC

## 2023-06-05 NOTE — ED Provider Notes (Signed)
 [facility] Provider Note    Event Date/Time   First MD Initiated Contact with Patient 06/05/23 1620     (approximate)   History   Hip Pain and Back Pain   HPI  Tina Chang is a 58 y.o. female who presents with complaints of right hip pain, she reports this has been hurting for several days, she reports she has had this in the past seems to flareup intermittently.  She reports it is painful to walk but she is able to do so.  No fevers or chills.     Physical Exam   Triage Vital Signs: ED Triage Vitals [06/05/23 1438]  Encounter Vitals Group     BP (!) 154/98     Systolic BP Percentile      Diastolic BP Percentile      Pulse Rate 96     Resp 20     Temp 98.5 F (36.9 C)     Temp Source Oral     SpO2 99 %     Weight 54.4 kg (120 lb)     Height 1.676 m (5' 6)     Head Circumference      Peak Flow      Pain Score 10     Pain Loc      Pain Education      Exclude from Growth Chart     Most recent vital signs: Vitals:   06/05/23 1438  BP: (!) 154/98  Pulse: 96  Resp: 20  Temp: 98.5 F (36.9 C)  SpO2: 99%     General: Awake, no distress.  CV:  Good peripheral perfusion.  Resp:  Normal effort.  Abd:  No distention.  Other:  Ambulating well, full range of motion with some discomfort, normal pulses distally, no swelling   ED Results / Procedures / Treatments   Labs (all labs ordered are listed, but only abnormal results are displayed) Labs Reviewed - No data to display   EKG     RADIOLOGY X-ray viewed interpret by me, no acute abnormalities    PROCEDURES:  Critical Care performed:   Procedures   MEDICATIONS ORDERED IN ED: Medications  ketorolac  (TORADOL ) 30 MG/ML injection 30 mg (30 mg Intramuscular Given 06/05/23 1721)  predniSONE  (DELTASONE ) tablet 60 mg (60 mg Oral Given 06/05/23 1721)     IMPRESSION / MDM / ASSESSMENT AND PLAN / ED COURSE  I reviewed the triage vital signs and the nursing  notes. Patient's presentation is most consistent with exacerbation of chronic illness.  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  Will treat with NSAIDs, prednisone , outpatient follow-up with orthopedics recommended        FINAL CLINICAL IMPRESSION(S) / ED DIAGNOSES   Final diagnoses:  Right hip pain     Rx / DC Orders   ED Discharge Orders          Ordered    predniSONE  (DELTASONE ) 50 MG tablet  Daily with breakfast        06/05/23 1638    oxyCODONE -acetaminophen  (PERCOCET) 5-325 MG tablet  Every 8 hours PRN        06/05/23 1638    naproxen  (NAPROSYN ) 500 MG tablet  2 times daily with meals        06/05/23 1638             Note:  This document was prepared using Dragon voice recognition software and may include unintentional dictation errors.   Tina [place], MD 06/05/23 [phone]

## 2023-06-05 NOTE — ED Triage Notes (Signed)
 Pt c/o low back and R hip pain radiating down R leg x2 days.  Pain score 10/10.  Pt reports taking OTC medication w/o relief.   Pt denies injury.  Hx of sciatica on R side and had an epidural x"a couple years ago."

## 2023-06-07 DIAGNOSIS — M160 Bilateral primary osteoarthritis of hip: Secondary | ICD-10-CM | POA: Diagnosis not present

## 2023-06-07 DIAGNOSIS — M545 Low back pain, unspecified: Secondary | ICD-10-CM | POA: Diagnosis not present

## 2023-06-07 DIAGNOSIS — M25551 Pain in right hip: Secondary | ICD-10-CM | POA: Diagnosis not present

## 2023-06-07 DIAGNOSIS — M51362 Other intervertebral disc degeneration, lumbar region with discogenic back pain and lower extremity pain: Secondary | ICD-10-CM | POA: Diagnosis not present

## 2023-06-08 ENCOUNTER — Other Ambulatory Visit: Payer: Self-pay | Admitting: Orthopedic Surgery

## 2023-06-08 DIAGNOSIS — M1611 Unilateral primary osteoarthritis, right hip: Secondary | ICD-10-CM

## 2023-06-09 ENCOUNTER — Encounter: Payer: Self-pay | Admitting: Orthopedic Surgery

## 2023-06-15 ENCOUNTER — Ambulatory Visit
Admission: RE | Admit: 2023-06-15 | Discharge: 2023-06-15 | Disposition: A | Discharge status: Home / Self Care | Payer: BC Managed Care – PPO | Source: Ambulatory Visit | Attending: Orthopedic Surgery | Admitting: Orthopedic Surgery

## 2023-06-15 DIAGNOSIS — M160 Bilateral primary osteoarthritis of hip: Secondary | ICD-10-CM | POA: Diagnosis not present

## 2023-06-15 DIAGNOSIS — M1611 Unilateral primary osteoarthritis, right hip: Secondary | ICD-10-CM

## 2023-06-15 DIAGNOSIS — M25551 Pain in right hip: Secondary | ICD-10-CM | POA: Diagnosis not present

## 2023-06-15 DIAGNOSIS — R600 Localized edema: Secondary | ICD-10-CM | POA: Diagnosis not present

## 2023-06-24 DIAGNOSIS — R0981 Nasal congestion: Secondary | ICD-10-CM | POA: Diagnosis not present

## 2023-06-24 DIAGNOSIS — J029 Acute pharyngitis, unspecified: Secondary | ICD-10-CM | POA: Diagnosis not present

## 2023-06-24 DIAGNOSIS — R059 Cough, unspecified: Secondary | ICD-10-CM | POA: Diagnosis not present

## 2023-07-01 DIAGNOSIS — M545 Low back pain, unspecified: Secondary | ICD-10-CM | POA: Diagnosis not present

## 2023-07-01 DIAGNOSIS — M25551 Pain in right hip: Secondary | ICD-10-CM | POA: Diagnosis not present

## 2023-07-01 DIAGNOSIS — S32511D Fracture of superior rim of right pubis, subsequent encounter for fracture with routine healing: Secondary | ICD-10-CM | POA: Diagnosis not present

## 2023-11-04 ENCOUNTER — Other Ambulatory Visit: Payer: Self-pay | Admitting: Family Medicine

## 2023-11-04 DIAGNOSIS — I10 Essential (primary) hypertension: Secondary | ICD-10-CM

## 2023-11-08 NOTE — Telephone Encounter (Signed)
 Unable to refill per protocol, appointment needed.   Requested Prescriptions  Pending Prescriptions Disp Refills   lisinopril  (ZESTRIL ) 5 MG tablet [Pharmacy Med Name: LISINOPRIL  5 MG TAB] 90 tablet 3    Sig: TAKE 1 TABLET BY MOUTH ONCE DAILY     Cardiovascular:  ACE Inhibitors Failed - 11/08/2023  8:04 AM      Failed - Cr in normal range and within 180 days    Creat  Date Value Ref Range Status  10/05/2022 1.11 (H) 0.50 - 1.03 mg/dL Final         Failed - K in normal range and within 180 days    Potassium  Date Value Ref Range Status  10/05/2022 5.5 (H) 3.5 - 5.3 mmol/L Final  03/05/2014 3.4 (L) 3.5 - 5.1 mmol/L Final         Failed - Last BP in normal range    BP Readings from Last 1 Encounters:  06/05/23 (!) 154/98         Failed - Valid encounter within last 6 months    Recent Outpatient Visits   None            Passed - Patient is not pregnant       metoprolol  succinate (TOPROL -XL) 25 MG 24 hr tablet [Pharmacy Med Name: METOPROLOL  SUCCINATE ER 25 MG TAB] 90 tablet 3    Sig: TAKE 1 TABLET BY MOUTH ONCE DAILY     Cardiovascular:  Beta Blockers Failed - 11/08/2023  8:04 AM      Failed - Last BP in normal range    BP Readings from Last 1 Encounters:  06/05/23 (!) 154/98         Failed - Valid encounter within last 6 months    Recent Outpatient Visits   None            Passed - Last Heart Rate in normal range    Pulse Readings from Last 1 Encounters:  06/05/23 96

## 2024-03-18 ENCOUNTER — Telehealth: Admitting: Physician Assistant

## 2024-03-18 DIAGNOSIS — M549 Dorsalgia, unspecified: Secondary | ICD-10-CM

## 2024-03-18 NOTE — Progress Notes (Signed)
 Patient states appointment scheduled  in error., She has an appointment with her provider tomorrow and has no needs at this time.

## 2024-03-18 NOTE — Patient Instructions (Signed)
 SABRA

## 2024-03-19 DIAGNOSIS — M545 Low back pain, unspecified: Secondary | ICD-10-CM | POA: Diagnosis not present

## 2024-03-19 DIAGNOSIS — M48062 Spinal stenosis, lumbar region with neurogenic claudication: Secondary | ICD-10-CM | POA: Diagnosis not present

## 2024-03-19 DIAGNOSIS — M4807 Spinal stenosis, lumbosacral region: Secondary | ICD-10-CM | POA: Diagnosis not present

## 2024-04-06 DIAGNOSIS — Z0000 Encounter for general adult medical examination without abnormal findings: Secondary | ICD-10-CM | POA: Diagnosis not present

## 2024-04-06 DIAGNOSIS — I10 Essential (primary) hypertension: Secondary | ICD-10-CM | POA: Diagnosis not present

## 2024-04-06 DIAGNOSIS — M48062 Spinal stenosis, lumbar region with neurogenic claudication: Secondary | ICD-10-CM | POA: Diagnosis not present

## 2024-04-06 DIAGNOSIS — Z8541 Personal history of malignant neoplasm of cervix uteri: Secondary | ICD-10-CM | POA: Diagnosis not present

## 2024-04-06 DIAGNOSIS — N261 Atrophy of kidney (terminal): Secondary | ICD-10-CM | POA: Diagnosis not present

## 2024-04-06 DIAGNOSIS — Z131 Encounter for screening for diabetes mellitus: Secondary | ICD-10-CM | POA: Diagnosis not present

## 2024-04-06 DIAGNOSIS — Z13220 Encounter for screening for lipoid disorders: Secondary | ICD-10-CM | POA: Diagnosis not present

## 2024-04-06 DIAGNOSIS — Z1331 Encounter for screening for depression: Secondary | ICD-10-CM | POA: Diagnosis not present

## 2024-04-06 DIAGNOSIS — I7300 Raynaud's syndrome without gangrene: Secondary | ICD-10-CM | POA: Diagnosis not present

## 2024-05-07 ENCOUNTER — Other Ambulatory Visit: Payer: Self-pay | Admitting: Gerontology

## 2024-05-07 DIAGNOSIS — Z1231 Encounter for screening mammogram for malignant neoplasm of breast: Secondary | ICD-10-CM
